# Patient Record
Sex: Male | Born: 1955 | Race: White | Hispanic: No | Smoking: Former smoker
Health system: Southern US, Community
[De-identification: ages and names within clinical notes are randomized; demographics above are authoritative.]

## PROBLEM LIST (undated history)

## (undated) DIAGNOSIS — I10 Essential (primary) hypertension: Secondary | ICD-10-CM

## (undated) DIAGNOSIS — E7800 Pure hypercholesterolemia, unspecified: Secondary | ICD-10-CM

---

## 1999-05-07 ENCOUNTER — Encounter: Payer: Self-pay | Admitting: Family Medicine

## 1999-05-07 ENCOUNTER — Ambulatory Visit (HOSPITAL_COMMUNITY): Admission: RE | Admit: 1999-05-07 | Discharge: 1999-05-07 | Payer: Self-pay | Admitting: Family Medicine

## 2001-01-08 ENCOUNTER — Encounter: Payer: Self-pay | Admitting: Family Medicine

## 2001-01-08 ENCOUNTER — Ambulatory Visit: Admission: RE | Admit: 2001-01-08 | Discharge: 2001-01-08 | Payer: Self-pay | Admitting: Family Medicine

## 2002-11-11 ENCOUNTER — Ambulatory Visit (HOSPITAL_COMMUNITY): Admission: RE | Admit: 2002-11-11 | Discharge: 2002-11-11 | Payer: Self-pay | Admitting: Family Medicine

## 2002-11-11 ENCOUNTER — Encounter: Payer: Self-pay | Admitting: Family Medicine

## 2002-12-04 ENCOUNTER — Encounter: Payer: Self-pay | Admitting: Family Medicine

## 2002-12-04 ENCOUNTER — Encounter: Payer: Self-pay | Admitting: Emergency Medicine

## 2002-12-04 ENCOUNTER — Emergency Department (HOSPITAL_COMMUNITY): Admission: EM | Admit: 2002-12-04 | Discharge: 2002-12-04 | Payer: Self-pay | Admitting: Emergency Medicine

## 2007-11-11 ENCOUNTER — Encounter (INDEPENDENT_AMBULATORY_CARE_PROVIDER_SITE_OTHER): Payer: Self-pay | Admitting: Family Medicine

## 2007-11-11 ENCOUNTER — Ambulatory Visit (HOSPITAL_COMMUNITY): Admission: RE | Admit: 2007-11-11 | Discharge: 2007-11-11 | Payer: Self-pay | Admitting: Family Medicine

## 2011-02-22 NOTE — Discharge Summary (Signed)
NAME:  Wayne Robinson, Wayne Robinson                       ACCOUNT NO.:  [id number]   MEDICAL RECORD NO.:  [id number]                   PATIENT TYPE:  EMS   LOCATION:  [facility]                                 FACILITY:  [facility]   PHYSICIAN:  Talmage Coin, M.D.                  DATE OF BIRTH:  1956/10/03   DATE OF ADMISSION:  12/04/2002  DATE OF DISCHARGE:  12/04/2002                                 DISCHARGE SUMMARY   DISCHARGE DIAGNOSES:  1. Atypical chest pain.  2. Tobacco use.  3. Anxiety disorder, not otherwise specified.  4. Dyslipidemia.   DISCHARGE MEDICATIONS:  1. Zoloft 25 mg daily.  2. Xanax 5 mg as needed as previously directed.  3. Zocor 40 mg daily.  4. Viagra as needed.   ADMISSION HISTORY:  The patient is a 55 year old man with anxiety disorder,  dyslipidemia and tobacco use.  He presented to [facility] Emergency  Department complaining of intermittent chest pain.  The episodes had been  infrequent, occurring every 3-4 months since 1997.  However, on the day of  admission he had several episodes of substernal chest tightness with  associated dyspnea, diaphoresis and lip tingling.  The episodes were  generally brief lasting less than one minute.  He describes no exertional  pain.  The pain was non pleuritic and had no other contributing factors.   PHYSICAL EXAMINATION:  VITAL SIGNS:  Temperature 98.7, blood pressure  122/75, pulse 68, respirations 15, O2 saturation 97% on 2 liters per minute  nasal cannula.  GENERAL:  He was in no apparent distress.  HEAD/EYES/NECK/ABDOMEN/EXTREMITIES/NEUROLOGIC/MENTAL STATUS/SKIN:  Exams are  unremarkable.  CARDIOVASCULAR:  Unremarkable except trace pedal edema, bilaterally.   LABORATORY DATA:  He had a few crackles at the left base, otherwise air  movement is good, had clear lung fields otherwise and with resonance  throughout.  Initial laboratory data included a CBC with WBC 9.4, hemoglobin  14.4, platelets 157, neutrophils 5.4,  PT 12, PTT 30, sodium 139, potassium  3.5, bicarbonate 28, BUN 20, creatinine 1.3, glucose 107, CK 139, MB 1.4,  troponin I 0.04.  EKG at the time of admission revealed normal sinus rate at  66, normal intervals, normal axis, possibly an early incomplete right bundle  branch block and nonspecific ST-T wave changes.  No old tracings were  available.  Portable chest x-ray showed patchy left lower lobe atelectasis  versus infiltrate.   HOSPITAL COURSE:  Atypical chest pain:  The episodes seems consistent with  panic attack and would not clearly consistent with an acute coronary  syndrome, or other life threatening causes of chest pain.  However, given  his risk factors for cardiovascular disease, and nonspecific EKG findings,  and the frequency of episodes on the day of admission, it was recommended to  the patient that we admit him to rule out myocardial infarction by serial  cardiac enzymes, telemetry and repeat EKG.  The patient was also informed  that the few crackles at the left lower lobe and the abnormality on chest  film may simply represent atelectasis or prior scarring from pneumonias in  the past, or possibly even an early pneumonia, while he did not have signs  including the onset of fever, productive cough, or leukocytosis.  Despite  our concerns that the patient should be evaluated to rule out myocardial  infarction, the patient decided to leave against medical advice on the day  of admission.  He was encouraged to follow up with his primary care  physician, Meredith Staggers, M.D. at [facility] [place]  [place].  The patient seems to be aware of risks and  benefits and appears to have the capacity to make medical decisions.                                               Talmage Coin, M.D.    Cleotis Lema  D:  12/05/2002  T:  12/05/2002  Job:  [id number]

## 2012-03-16 DIAGNOSIS — J189 Pneumonia, unspecified organism: Secondary | ICD-10-CM

## 2018-09-21 ENCOUNTER — Other Ambulatory Visit: Payer: Self-pay | Admitting: Family Medicine

## 2018-09-21 DIAGNOSIS — M5412 Radiculopathy, cervical region: Secondary | ICD-10-CM

## 2018-09-22 ENCOUNTER — Other Ambulatory Visit: Payer: Self-pay | Admitting: Family Medicine

## 2018-09-22 DIAGNOSIS — S0540XA Penetrating wound of orbit with or without foreign body, unspecified eye, initial encounter: Secondary | ICD-10-CM

## 2018-10-01 ENCOUNTER — Ambulatory Visit
Admission: RE | Admit: 2018-10-01 | Discharge: 2018-10-01 | Disposition: A | Discharge status: Home / Self Care | Payer: 59 | Source: Ambulatory Visit | Attending: Family Medicine | Admitting: Family Medicine

## 2018-10-01 DIAGNOSIS — S0540XA Penetrating wound of orbit with or without foreign body, unspecified eye, initial encounter: Secondary | ICD-10-CM

## 2018-10-01 DIAGNOSIS — M5412 Radiculopathy, cervical region: Secondary | ICD-10-CM

## 2018-11-18 DIAGNOSIS — M47812 Spondylosis without myelopathy or radiculopathy, cervical region: Secondary | ICD-10-CM | POA: Diagnosis not present

## 2018-11-18 DIAGNOSIS — M4802 Spinal stenosis, cervical region: Secondary | ICD-10-CM | POA: Diagnosis not present

## 2018-11-18 DIAGNOSIS — M5030 Other cervical disc degeneration, unspecified cervical region: Secondary | ICD-10-CM | POA: Diagnosis not present

## 2018-11-18 DIAGNOSIS — M5412 Radiculopathy, cervical region: Secondary | ICD-10-CM | POA: Diagnosis not present

## 2018-11-25 DIAGNOSIS — M5030 Other cervical disc degeneration, unspecified cervical region: Secondary | ICD-10-CM | POA: Diagnosis not present

## 2018-11-25 DIAGNOSIS — M9981 Other biomechanical lesions of cervical region: Secondary | ICD-10-CM | POA: Diagnosis not present

## 2018-11-25 DIAGNOSIS — M47812 Spondylosis without myelopathy or radiculopathy, cervical region: Secondary | ICD-10-CM | POA: Diagnosis not present

## 2018-12-08 DIAGNOSIS — G8929 Other chronic pain: Secondary | ICD-10-CM | POA: Diagnosis not present

## 2018-12-08 DIAGNOSIS — I10 Essential (primary) hypertension: Secondary | ICD-10-CM | POA: Diagnosis not present

## 2018-12-08 DIAGNOSIS — M542 Cervicalgia: Secondary | ICD-10-CM | POA: Diagnosis not present

## 2018-12-08 DIAGNOSIS — M5412 Radiculopathy, cervical region: Secondary | ICD-10-CM | POA: Diagnosis not present

## 2018-12-16 DIAGNOSIS — I10 Essential (primary) hypertension: Secondary | ICD-10-CM | POA: Diagnosis not present

## 2018-12-16 DIAGNOSIS — G8929 Other chronic pain: Secondary | ICD-10-CM | POA: Diagnosis not present

## 2018-12-16 DIAGNOSIS — M542 Cervicalgia: Secondary | ICD-10-CM | POA: Diagnosis not present

## 2018-12-16 DIAGNOSIS — M5412 Radiculopathy, cervical region: Secondary | ICD-10-CM | POA: Diagnosis not present

## 2018-12-22 DIAGNOSIS — I10 Essential (primary) hypertension: Secondary | ICD-10-CM | POA: Diagnosis not present

## 2018-12-22 DIAGNOSIS — G8929 Other chronic pain: Secondary | ICD-10-CM | POA: Diagnosis not present

## 2018-12-22 DIAGNOSIS — M5412 Radiculopathy, cervical region: Secondary | ICD-10-CM | POA: Diagnosis not present

## 2018-12-22 DIAGNOSIS — M542 Cervicalgia: Secondary | ICD-10-CM | POA: Diagnosis not present

## 2019-02-16 DIAGNOSIS — M5412 Radiculopathy, cervical region: Secondary | ICD-10-CM | POA: Diagnosis not present

## 2019-02-16 DIAGNOSIS — M436 Torticollis: Secondary | ICD-10-CM | POA: Diagnosis not present

## 2019-02-16 DIAGNOSIS — M542 Cervicalgia: Secondary | ICD-10-CM | POA: Diagnosis not present

## 2019-03-03 DIAGNOSIS — M5412 Radiculopathy, cervical region: Secondary | ICD-10-CM | POA: Diagnosis not present

## 2019-03-03 DIAGNOSIS — M436 Torticollis: Secondary | ICD-10-CM | POA: Diagnosis not present

## 2019-03-03 DIAGNOSIS — M542 Cervicalgia: Secondary | ICD-10-CM | POA: Diagnosis not present

## 2019-03-08 ENCOUNTER — Other Ambulatory Visit: Payer: Self-pay | Admitting: Family Medicine

## 2019-03-08 DIAGNOSIS — E782 Mixed hyperlipidemia: Secondary | ICD-10-CM | POA: Diagnosis not present

## 2019-03-08 DIAGNOSIS — M25511 Pain in right shoulder: Secondary | ICD-10-CM

## 2019-03-08 DIAGNOSIS — Z0000 Encounter for general adult medical examination without abnormal findings: Secondary | ICD-10-CM | POA: Diagnosis not present

## 2019-03-08 DIAGNOSIS — J309 Allergic rhinitis, unspecified: Secondary | ICD-10-CM | POA: Diagnosis not present

## 2019-03-08 DIAGNOSIS — I10 Essential (primary) hypertension: Secondary | ICD-10-CM | POA: Diagnosis not present

## 2019-03-23 DIAGNOSIS — M5412 Radiculopathy, cervical region: Secondary | ICD-10-CM | POA: Diagnosis not present

## 2019-03-23 DIAGNOSIS — R29898 Other symptoms and signs involving the musculoskeletal system: Secondary | ICD-10-CM | POA: Diagnosis not present

## 2019-03-23 DIAGNOSIS — M5030 Other cervical disc degeneration, unspecified cervical region: Secondary | ICD-10-CM | POA: Diagnosis not present

## 2019-04-06 DIAGNOSIS — E782 Mixed hyperlipidemia: Secondary | ICD-10-CM | POA: Diagnosis not present

## 2019-04-06 DIAGNOSIS — Z125 Encounter for screening for malignant neoplasm of prostate: Secondary | ICD-10-CM | POA: Diagnosis not present

## 2019-04-12 DIAGNOSIS — M5030 Other cervical disc degeneration, unspecified cervical region: Secondary | ICD-10-CM | POA: Diagnosis not present

## 2019-04-12 DIAGNOSIS — M542 Cervicalgia: Secondary | ICD-10-CM | POA: Diagnosis not present

## 2019-04-12 DIAGNOSIS — M5412 Radiculopathy, cervical region: Secondary | ICD-10-CM | POA: Diagnosis not present

## 2019-04-12 DIAGNOSIS — M4802 Spinal stenosis, cervical region: Secondary | ICD-10-CM | POA: Diagnosis not present

## 2019-05-04 ENCOUNTER — Other Ambulatory Visit: Payer: Self-pay

## 2019-05-04 ENCOUNTER — Ambulatory Visit
Admission: RE | Admit: 2019-05-04 | Discharge: 2019-05-04 | Disposition: A | Discharge status: Home / Self Care | Payer: 59 | Source: Ambulatory Visit | Attending: Family Medicine | Admitting: Family Medicine

## 2019-05-04 DIAGNOSIS — M25511 Pain in right shoulder: Secondary | ICD-10-CM | POA: Diagnosis not present

## 2019-05-24 DIAGNOSIS — M7541 Impingement syndrome of right shoulder: Secondary | ICD-10-CM | POA: Diagnosis not present

## 2019-05-24 DIAGNOSIS — M25511 Pain in right shoulder: Secondary | ICD-10-CM | POA: Diagnosis not present

## 2019-06-10 ENCOUNTER — Other Ambulatory Visit: Payer: Self-pay

## 2019-06-10 ENCOUNTER — Encounter: Payer: Self-pay | Admitting: Rehabilitative and Restorative Service Providers"

## 2019-06-10 ENCOUNTER — Ambulatory Visit (INDEPENDENT_AMBULATORY_CARE_PROVIDER_SITE_OTHER): Payer: BLUE CROSS/BLUE SHIELD | Admitting: Rehabilitative and Restorative Service Providers"

## 2019-06-10 DIAGNOSIS — M25511 Pain in right shoulder: Secondary | ICD-10-CM

## 2019-06-10 DIAGNOSIS — R29898 Other symptoms and signs involving the musculoskeletal system: Secondary | ICD-10-CM

## 2019-06-10 DIAGNOSIS — R293 Abnormal posture: Secondary | ICD-10-CM

## 2019-06-10 DIAGNOSIS — M542 Cervicalgia: Secondary | ICD-10-CM | POA: Diagnosis not present

## 2019-06-10 DIAGNOSIS — G8929 Other chronic pain: Secondary | ICD-10-CM

## 2019-06-10 NOTE — Patient Instructions (Signed)
Access Code: [email or web address]  URL: [email or web address]  Date: 06/10/2019  Prepared by: Gillermo Murdoch   Exercises  Seated Cervical Retraction - 10 reps - 1 sets - 3x daily - 7x weekly  Standing Scapular Retraction - 10 reps - 1 sets - 10 hold - 3x daily - 7x weekly  Shoulder External Rotation and Scapular Retraction - 10 reps - 1 sets - hold - 3x daily - 7x weekly  Doorway Pec Stretch at 60 Degrees Abduction - 3 reps - 1 sets - 3x daily - 7x weekly  Doorway Pec Stretch at 90 Degrees Abduction - 3 reps - 1 sets - 30 seconds hold - 3x daily - 7x weekly  Doorway Pec Stretch at 120 Degrees Abduction - 3 reps - 1 sets - 30 second hold hold - 3x daily - 7x weekly

## 2019-06-10 NOTE — Therapy (Addendum)
[facility] [address] [place], [place], [address] Phone: [phone]   Fax:  [phone]  Physical Therapy Evaluation  Patient Details  Name: Wayne Robinson MRN: [id number] Date of Birth: 1956-06-02 Referring Provider (PT): Dr Justice Britain    Encounter Date: 06/10/2019  PT End of Session - 06/10/19 1710    Visit Number  1    Number of Visits  12    Date for PT Re-Evaluation  07/22/19    PT Start Time  1708    PT Stop Time  8315    PT Time Calculation (min)  50 min    Activity Tolerance  Patient tolerated treatment well       History reviewed. No pertinent past medical history.  History reviewed. No pertinent surgical history.  There were no vitals filed for this visit.   Subjective Assessment - 06/10/19 1711    Subjective  Patient reports that he has had pain in the Rt shoulder and neck area for the past 15-16 months. He experienced a twinsting pull Rt Shoulder across body when his 60 pound dog pulled his arm while on his leash. He experienced pain but did not seek care until a few months later. Patient has had 2 previous courses of PT and several injections. Shoulder has imporved some but he continues to have pain and intermittent numbness and tingling iin Rt UE.    Pertinent History  HTN; high cholestrol    Diagnostic tests  MRI - RC tendonipathy/tendinosis primarily supraspinatus; posterior inferior labral tear; degenerative joint disease AC joint; thickening ov capsule at axillary recess - adhesive capsulitis    Patient Stated Goals  get rid of the pain and get back to normal function Rt UE    Currently in Pain?  Yes    Pain Score  1     Pain Location  Shoulder    Pain Orientation  Right    Pain Descriptors / Indicators  Dull;Aching    Pain Type  Chronic pain    Pain Radiating Towards  shoulder blade - sometimes tingling into arm and hand    Pain Onset  More than a month ago    Pain Frequency  Intermittent     Aggravating Factors   lying on Rt side; static postures at shoulder level; cold air on arm    Pain Relieving Factors  ice on shoulder; heat      See med list in Initial Evaluation    [facility] PT Assessment - 06/10/19 0001      Assessment   Medical Diagnosis  Rt shoulder dysfunction     Referring Provider (PT)  Dr Justice Britain     Onset Date/Surgical Date  01/05/18    Hand Dominance  Right    Next MD Visit  06/16/2019      Precautions   Precautions  None      Balance Screen   Has the patient fallen in the past 6 months  No    Has the patient had a decrease in activity level because of a fear of falling?   No    Is the patient reluctant to leave their home because of a fear of falling?   No      Prior Function   Level of Independence  Independent    Vocation  Full time employment    Vocation Requirements  works in [place] - 10 yrs       Sensation  Additional Comments  intermittent numbness and tingling in the Rt UE       Posture/Postural Control   Posture Comments  head ofrward; shoudlers rounded and elevated; head of the humerus anterior in orientation; scapulae abducted and rotated along the thoracic wall Rt > Lt       AROM   Right/Left Shoulder  --   assessed w/ pt standing; IR/ER w/ shd at 90 deg abd    Right Shoulder Extension  50 Degrees    Right Shoulder Flexion  155 Degrees    Right Shoulder ABduction  157 Degrees    Right Shoulder Internal Rotation  28 Degrees    Right Shoulder External Rotation  95 Degrees    Left Shoulder Extension  50 Degrees    Left Shoulder Flexion  154 Degrees    Left Shoulder ABduction  163 Degrees    Left Shoulder Internal Rotation  22 Degrees    Left Shoulder External Rotation  93 Degrees    Cervical Flexion  54    Cervical Extension  60    Cervical - Right Side Bend  34    Cervical - Left Side Bend  28    Cervical - Right Rotation  67    Cervical - Left Rotation  58      Strength   Overall Strength Comments  poor  posterior shoulder girdle strength/postural control       Palpation   Spinal mobility  tightness w/ CPA mobs lower cervical to upper thoracic spine     Palpation comment  muscular tightness Rt > Lt pecs; upper trpa; leveator; teres                Objective measurements completed on examination: See above findings.      [facility] Adult PT Treatment/Exercise - 06/10/19 0001      Therapeutic Activites    Therapeutic Activities  --   myofacial ball release work standing      Neuro Re-ed    Neuro Re-ed Details   postural education - neuromuscular re-education       Shoulder Exercises: Standing   Row Limitations  row stepping back bow and arrow - blue TB x 10 verbal and tactile cues for scapular position     Other Standing Exercises  axial extensin 10 sec x 5 reps; scap squeeze 10 sec x 5; L's x 10 with noodle       Shoulder Exercises: Stretch   Other Shoulder Stretches  doorway stretch 30 sec x 2 reps lower and mid position;  20-25 sec hold moving slightly toward higher position x 3 reps to pt tolerance     Other Shoulder Stretches  prolonged snow angel ~ 1 min x 1 rep UE's ~ 60 deg abd                   PT Long Term Goals - 06/10/19 1819      PT LONG TERM GOAL #1   Title  Iprove posture and alignment with patient to demonstrate improved upright posture with posterior shoulder girdle engaged    Time  6    Period  Weeks    Status  New    Target Date  07/22/19      PT LONG TERM GOAL #2   Title  Decrease frequency of Rt UE radicular symptoms by 50-70% alowing pt to use Rt UE more normally    Time  6    Period  Weeks  Status  New    Target Date  07/22/19      PT LONG TERM GOAL #3   Title  Return patient to normal functional activities with Rt UE with minimal symptoms    Time  6    Period  Weeks    Status  New    Target Date  07/22/19      PT LONG TERM GOAL #4   Title  Independent in HEP    Time  6    Period  Weeks    Status  New    Target Date   07/22/19             Plan - 06/10/19 1746    Clinical Impression Statement  Patient presents with Rt upper quarter dysfunction including Rt UE intermittnet numbness and tingling. Symptoms have been present for more than a year. He has poor posture and alignment; decreased posterior shoulder girdle strength and postural stability; intermittent neurological symptoms; muscular tightness to palpation. Patient will benefit from PY to address problems identified.    Stability/Clinical Decision Making  Stable/Uncomplicated    Clinical Decision Making  Low    Rehab Potential  Good    PT Frequency  2x / week    PT Duration  6 weeks    PT Treatment/Interventions  Patient/family education;ADLs/Self Care Home Management;Cryotherapy;Electrical Stimulation;Iontophoresis 4mg /ml Dexamethasone;Moist Heat;Ultrasound;Manual techniques;Neuromuscular re-education;Therapeutic activities;Therapeutic exercise;Taping;Dry needling    PT Next Visit Plan  review HEP; add manual work Rt shoulder/cervical musculature; postural correction/education; neuromuscular re-education; posterior shoulder girdle strengthening - FOTO    PT Home Exercise Plan  [id number]    Consulted and Agree with Plan of Care  Patient       Patient will benefit from skilled therapeutic intervention in order to improve the following deficits and impairments:  Pain, Increased fascial restricitons, Increased muscle spasms, Decreased strength, Decreased mobility, Decreased range of motion, Decreased activity tolerance  Visit Diagnosis: Chronic right shoulder pain - Plan: PT plan of care cert/re-cert  Cervicalgia - Plan: PT plan of care cert/re-cert  Other symptoms and signs involving the musculoskeletal system - Plan: PT plan of care cert/re-cert  Abnormal posture - Plan: PT plan of care cert/re-cert     Problem List There are no active problems to display for this patient.   Jennell Janosik Rober Minion PT, MPH  06/10/2019, 6:24 PM  [facility] [address] [place], [place], [address] Phone: [phone]   Fax:  [phone]  Name: Wayne Robinson MRN: [id number] Date of Birth: 20-Jul-1956

## 2019-06-16 DIAGNOSIS — M25511 Pain in right shoulder: Secondary | ICD-10-CM | POA: Diagnosis not present

## 2019-10-12 ENCOUNTER — Emergency Department
Admission: EM | Admit: 2019-10-12 | Discharge: 2019-10-12 | Disposition: A | Discharge status: Home / Self Care | Payer: BLUE CROSS/BLUE SHIELD | Source: Home / Self Care

## 2019-10-12 ENCOUNTER — Emergency Department (INDEPENDENT_AMBULATORY_CARE_PROVIDER_SITE_OTHER): Payer: BLUE CROSS/BLUE SHIELD

## 2019-10-12 ENCOUNTER — Other Ambulatory Visit: Payer: Self-pay

## 2019-10-12 DIAGNOSIS — R079 Chest pain, unspecified: Secondary | ICD-10-CM

## 2019-10-12 DIAGNOSIS — J209 Acute bronchitis, unspecified: Secondary | ICD-10-CM | POA: Diagnosis not present

## 2019-10-12 DIAGNOSIS — R0602 Shortness of breath: Secondary | ICD-10-CM | POA: Diagnosis not present

## 2019-10-12 HISTORY — DX: Essential (primary) hypertension: I10

## 2019-10-12 HISTORY — DX: Pure hypercholesterolemia, unspecified: E78.00

## 2019-10-12 MED ORDER — ALBUTEROL SULFATE HFA 108 (90 BASE) MCG/ACT IN AERS: 1.0000 | INHALATION_SPRAY | Freq: Four times a day (QID) | RESPIRATORY_TRACT | 0 refills | Status: DC | PRN

## 2019-10-12 MED ORDER — PREDNISONE 10 MG PO TABS: 40.0000 mg | ORAL_TABLET | Freq: Every day | ORAL | 0 refills | Status: AC

## 2019-10-12 NOTE — ED Triage Notes (Signed)
Going on for a while.  Burning in upper chest, comes and goes..will be fine for a few days then starts again.  Happens more when lying in bed in the morning.  Reminds him of when he previously had pneumonia

## 2019-10-12 NOTE — Discharge Instructions (Addendum)
Your chest xray was normal  We will treat your symptoms as bronchitis with prednisone and albuterol Please quarantine until receiving your covid swab

## 2019-10-12 NOTE — ED Provider Notes (Signed)
[facility]    CSN: [id number] Arrival date & time: 10/12/19  1712      History   Chief Complaint Chief Complaint  Patient presents with  . Back Pain  . Chest Pain    HPI Wayne Robinson is a 64 y.o. male.   64 year old male, with history of high cholesterol, hypertension, presenting today complaining of a burning sensation in his chest as well as some back pain.  Patient states that he has had this burning sensation in his upper chest, worse with deep breathing, for the past few weeks.  States that he is also had some pain in his thoracic region bilaterally.  States that he has had similar symptoms in the past and had pneumonia.  States that he does not want to be here today but his daughter is concerned and made him come.  Patient denies any cough or congestion.  No fever or chills.  The history is provided by the patient.  Chest Pain Pain location:  Substernal area Pain quality: burning   Pain radiates to:  Does not radiate Pain severity:  Mild Onset quality:  Gradual Duration:  4 weeks Timing:  Intermittent Progression:  Unchanged Chronicity:  Recurrent Context: breathing   Context: not drug use, not eating, not intercourse, not lifting and not movement   Relieved by:  Nothing Worsened by:  Nothing Ineffective treatments:  None tried Associated symptoms: back pain, cough and shortness of breath   Associated symptoms: no abdominal pain, no altered mental status, no claudication, no dizziness, no fatigue, no fever, no headache, no lower extremity edema, no near-syncope, no numbness, no palpitations, no vomiting and no weakness     Past Medical History:  Diagnosis Date  . High cholesterol   . Hypertension     There are no problems to display for this patient.   No past surgical history on file.     Home Medications    Prior to Admission medications   Medication Sig Start Date End Date Taking? Authorizing Provider  albuterol (VENTOLIN HFA) 108  (90 Base) MCG/ACT inhaler Inhale 1-2 puffs into the lungs every 6 (six) hours as needed for wheezing or shortness of breath. 10/12/19   Beonca Gibb C, PA-C  amLODipine-atorvastatin (CADUET) 5-10 MG tablet Take 1 tablet by mouth daily.    [provider]  aspirin EC 81 MG tablet Take 81 mg by mouth daily.    [provider]  atorvastatin (LIPITOR) 20 MG tablet Take 20 mg by mouth daily.    [provider]  losartan (COZAAR) 50 MG tablet Take 50 mg by mouth daily.    [provider]  predniSONE (DELTASONE) 10 MG tablet Take 4 tablets (40 mg total) by mouth daily for 5 days. 10/12/19 10/17/19  Alecia Lemming, PA-C    Family History No family history on file.  Social History Social History   Tobacco Use  . Smoking status: Former Smoker    Quit date: 10/12/2015    Years since quitting: 4.0  . Smokeless tobacco: Never Used  Substance Use Topics  . Alcohol use: Yes    Comment: very seldom  . Drug use: Not on file     Allergies   Gabapentin   Review of Systems Review of Systems  Constitutional: Negative for chills, fatigue and fever.  HENT: Negative for ear pain and sore throat.   Eyes: Negative for pain and visual disturbance.  Respiratory: Positive for cough and shortness of breath.  Cardiovascular: Positive for chest pain. Negative for palpitations, claudication and near-syncope.  Gastrointestinal: Negative for abdominal pain and vomiting.  Genitourinary: Negative for dysuria and hematuria.  Musculoskeletal: Positive for back pain. Negative for arthralgias.  Skin: Negative for color change and rash.  Neurological: Negative for dizziness, seizures, syncope, weakness, numbness and headaches.  All other systems reviewed and are negative.    Physical Exam Triage Vital Signs ED Triage Vitals  Enc Vitals Group     BP 10/12/19 1730 (!) 139/91     Pulse Rate 10/12/19 1730 69     Resp 10/12/19 1730 20     Temp 10/12/19 1730 98.9 F (37.2 C)      Temp src --      SpO2 10/12/19 1730 99 %     Weight 10/12/19 1731 185 lb (83.9 kg)     Height 10/12/19 1731 6' (1.829 m)     Head Circumference --      Peak Flow --      Pain Score 10/12/19 1731 3     Pain Loc --      Pain Edu? --      Excl. in GC? --    No data found.  Updated Vital Signs BP (!) 139/91 (BP Location: Left Arm)   Pulse 69   Temp 98.9 F (37.2 C)   Resp 20   Ht 6' (1.829 m)   Wt 185 lb (83.9 kg)   SpO2 99%   BMI 25.09 kg/m   Visual Acuity Right Eye Distance:   Left Eye Distance:   Bilateral Distance:    Right Eye Near:   Left Eye Near:    Bilateral Near:     Physical Exam Vitals and nursing note reviewed.  Constitutional:      Appearance: He is well-developed.  HENT:     Head: Normocephalic and atraumatic.  Eyes:     Conjunctiva/sclera: Conjunctivae normal.  Cardiovascular:     Rate and Rhythm: Normal rate and regular rhythm.     Heart sounds: No murmur.  Pulmonary:     Effort: Pulmonary effort is normal. No respiratory distress.     Breath sounds: Normal breath sounds.  Abdominal:     Palpations: Abdomen is soft.     Tenderness: There is no abdominal tenderness.  Musculoskeletal:     Cervical back: Neck supple.  Skin:    General: Skin is warm and dry.  Neurological:     Mental Status: He is alert.      UC Treatments / Results  Labs (all labs ordered are listed, but only abnormal results are displayed) Labs Reviewed  NOVEL CORONAVIRUS, NAA    EKG   Radiology DG Chest 2 View  Result Date: 10/12/2019 CLINICAL DATA:  Chest burning and shortness of breath. EXAM: CHEST - 2 VIEW COMPARISON:  12/01/2007. FINDINGS: The heart size and mediastinal contours are within normal limits. Both lungs are clear. The visualized skeletal structures are unremarkable. IMPRESSION: No active cardiopulmonary disease. Electronically Signed   By: Katherine Mantle M.D.   On: 10/12/2019 18:23    Procedures Procedures (including critical care  time)  Medications Ordered in UC Medications - No data to display  Initial Impression / Assessment and Plan / UC Course  I have reviewed the triage vital signs and the nursing notes.  Pertinent labs & imaging results that were available during my care of the patient were reviewed by me and considered in my medical decision making (see chart for details).  Burning sensation in his upper chest with deep breathing as well as some thoracic back pain.  Patient states he has had similar symptoms in the past and was diagnosed with pneumonia.  Chest x-ray pending.  Patient hesitant about Covid swab but has agreed. EKG unremarkable.  Chest x-ray without acute findings.  Will treat symptomatically for bronchitis with prednisone and albuterol.  He will quarantine until receiving results of his Covid swab. Final Clinical Impressions(s) / UC Diagnoses   Final diagnoses:  Chest pain, unspecified type  Acute bronchitis, unspecified organism     Discharge Instructions     Your chest xray was normal  We will treat your symptoms as bronchitis with prednisone and albuterol Please quarantine until receiving your covid swab    ED Prescriptions    Medication Sig Dispense Auth. Provider   predniSONE (DELTASONE) 10 MG tablet Take 4 tablets (40 mg total) by mouth daily for 5 days. 20 tablet [place] Dube C, PA-C   albuterol (VENTOLIN HFA) 108 (90 Base) MCG/ACT inhaler Inhale 1-2 puffs into the lungs every 6 (six) hours as needed for wheezing or shortness of breath. 8 g Alferd Obryant C, PA-C     PDMP not reviewed this encounter.   Phebe Colla, [place] 10/12/19 1830

## 2019-10-15 LAB — NOVEL CORONAVIRUS, NAA: SARS-CoV-2, NAA: NOT DETECTED

## 2020-03-13 DIAGNOSIS — Z125 Encounter for screening for malignant neoplasm of prostate: Secondary | ICD-10-CM | POA: Diagnosis not present

## 2020-03-13 DIAGNOSIS — J309 Allergic rhinitis, unspecified: Secondary | ICD-10-CM | POA: Diagnosis not present

## 2020-03-13 DIAGNOSIS — E782 Mixed hyperlipidemia: Secondary | ICD-10-CM | POA: Diagnosis not present

## 2020-03-13 DIAGNOSIS — I10 Essential (primary) hypertension: Secondary | ICD-10-CM | POA: Diagnosis not present

## 2020-03-13 DIAGNOSIS — Z0000 Encounter for general adult medical examination without abnormal findings: Secondary | ICD-10-CM | POA: Diagnosis not present

## 2020-03-13 DIAGNOSIS — F439 Reaction to severe stress, unspecified: Secondary | ICD-10-CM | POA: Diagnosis not present

## 2020-03-29 ENCOUNTER — Emergency Department (HOSPITAL_BASED_OUTPATIENT_CLINIC_OR_DEPARTMENT_OTHER): Payer: BLUE CROSS/BLUE SHIELD

## 2020-03-29 ENCOUNTER — Encounter (HOSPITAL_BASED_OUTPATIENT_CLINIC_OR_DEPARTMENT_OTHER): Payer: Self-pay | Admitting: Emergency Medicine

## 2020-03-29 ENCOUNTER — Other Ambulatory Visit: Payer: Self-pay

## 2020-03-29 ENCOUNTER — Encounter: Payer: Self-pay | Admitting: Emergency Medicine

## 2020-03-29 ENCOUNTER — Emergency Department (HOSPITAL_BASED_OUTPATIENT_CLINIC_OR_DEPARTMENT_OTHER)
Admission: EM | Admit: 2020-03-29 | Discharge: 2020-03-29 | Disposition: A | Discharge status: Home / Self Care | Payer: BLUE CROSS/BLUE SHIELD | Attending: Emergency Medicine | Admitting: Emergency Medicine

## 2020-03-29 ENCOUNTER — Emergency Department (INDEPENDENT_AMBULATORY_CARE_PROVIDER_SITE_OTHER)
Admission: EM | Admit: 2020-03-29 | Discharge: 2020-03-29 | Disposition: A | Discharge status: Home / Self Care | Payer: BLUE CROSS/BLUE SHIELD | Source: Home / Self Care | Attending: Family Medicine | Admitting: Family Medicine

## 2020-03-29 DIAGNOSIS — Z20822 Contact with and (suspected) exposure to covid-19: Secondary | ICD-10-CM | POA: Insufficient documentation

## 2020-03-29 DIAGNOSIS — R030 Elevated blood-pressure reading, without diagnosis of hypertension: Secondary | ICD-10-CM | POA: Diagnosis not present

## 2020-03-29 DIAGNOSIS — I10 Essential (primary) hypertension: Secondary | ICD-10-CM | POA: Diagnosis not present

## 2020-03-29 DIAGNOSIS — Z79899 Other long term (current) drug therapy: Secondary | ICD-10-CM | POA: Diagnosis not present

## 2020-03-29 DIAGNOSIS — R42 Dizziness and giddiness: Secondary | ICD-10-CM | POA: Diagnosis not present

## 2020-03-29 DIAGNOSIS — Z87891 Personal history of nicotine dependence: Secondary | ICD-10-CM | POA: Diagnosis not present

## 2020-03-29 DIAGNOSIS — R55 Syncope and collapse: Secondary | ICD-10-CM | POA: Diagnosis not present

## 2020-03-29 DIAGNOSIS — Z7982 Long term (current) use of aspirin: Secondary | ICD-10-CM | POA: Insufficient documentation

## 2020-03-29 DIAGNOSIS — R519 Headache, unspecified: Secondary | ICD-10-CM | POA: Diagnosis not present

## 2020-03-29 LAB — COMPREHENSIVE METABOLIC PANEL
ALT: 29 U/L (ref 0–44)
AST: 26 U/L (ref 15–41)
Albumin: 4.2 g/dL (ref 3.5–5.0)
Alkaline Phosphatase: 77 U/L (ref 38–126)
Anion gap: 9 (ref 5–15)
BUN: 17 mg/dL (ref 8–23)
CO2: 26 mmol/L (ref 22–32)
Calcium: 8.8 mg/dL — ABNORMAL LOW (ref 8.9–10.3)
Chloride: 104 mmol/L (ref 98–111)
Creatinine, Ser: 1.15 mg/dL (ref 0.61–1.24)
GFR calc Af Amer: >60 mL/min (ref >60)
GFR calc non Af Amer: >60 mL/min (ref >60)
Glucose, Bld: 106 mg/dL — ABNORMAL HIGH (ref 70–99)
Potassium: 3.7 mmol/L (ref 3.5–5.1)
Sodium: 139 mmol/L (ref 135–145)
Total Bilirubin: 0.8 mg/dL (ref 0.3–1.2)
Total Protein: 7.4 g/dL (ref 6.5–8.1)

## 2020-03-29 LAB — CBC WITH DIFFERENTIAL/PLATELET
Abs Immature Granulocytes: 0.02 10*3/uL (ref 0.00–0.07)
Basophils Absolute: 0.1 10*3/uL (ref 0.0–0.1)
Basophils Relative: 1 %
Eosinophils Absolute: 0.2 10*3/uL (ref 0.0–0.5)
Eosinophils Relative: 3 %
HCT: 45.6 % (ref 39.0–52.0)
Hemoglobin: 15.3 g/dL (ref 13.0–17.0)
Immature Granulocytes: 0 %
Lymphocytes Relative: 24 %
Lymphs Abs: 1.5 10*3/uL (ref 0.7–4.0)
MCH: 29.8 pg (ref 26.0–34.0)
MCHC: 33.6 g/dL (ref 30.0–36.0)
MCV: 88.9 fL (ref 80.0–100.0)
Monocytes Absolute: 0.5 10*3/uL (ref 0.1–1.0)
Monocytes Relative: 8 %
Neutro Abs: 3.9 10*3/uL (ref 1.7–7.7)
Neutrophils Relative %: 64 %
Platelets: 211 10*3/uL (ref 150–400)
RBC: 5.13 MIL/uL (ref 4.22–5.81)
RDW: 13 % (ref 11.5–15.5)
WBC: 6 10*3/uL (ref 4.0–10.5)
nRBC: 0 % (ref 0.0–0.2)

## 2020-03-29 LAB — TROPONIN I (HIGH SENSITIVITY)
Troponin I (High Sensitivity): 12 ng/L (ref <18)
Troponin I (High Sensitivity): 12 ng/L (ref <18)

## 2020-03-29 LAB — POCT FASTING CBG KUC MANUAL ENTRY: POCT Glucose (KUC): 139 mg/dL — AB (ref 70–99)

## 2020-03-29 LAB — SARS CORONAVIRUS 2 BY RT PCR (HOSPITAL ORDER, PERFORMED IN ~~LOC~~ HOSPITAL LAB): SARS Coronavirus 2: NEGATIVE

## 2020-03-29 MED ORDER — MECLIZINE HCL 25 MG PO TABS
25.0000 mg | ORAL_TABLET | Freq: Once | ORAL | Status: AC
  Administered 2020-03-29: 25 mg via ORAL
  Filled 2020-03-29: qty 1

## 2020-03-29 MED ORDER — MECLIZINE HCL 25 MG PO TABS: 25.0000 mg | ORAL_TABLET | Freq: Three times a day (TID) | ORAL | 0 refills | Status: DC | PRN

## 2020-03-29 NOTE — ED Triage Notes (Addendum)
Dizziness & lightheaded today pta, pt brought back to procedure room on arrival- took BP meds at 0630 - unsteady gait- pt skin clammy, c/o mild nausea- denies chest pain/ facial pain or arm numbness- denies back pain  Dr Cathren Harsh to room - EKG done for HTN - neuro intact-pt was driven to urgent care by co-worker

## 2020-03-29 NOTE — ED Notes (Signed)
Call to [facility] ER triage nurse Lanora Manis) relayed pt information - triage nurse to update Dr Adela Lank

## 2020-03-29 NOTE — Discharge Instructions (Signed)
Please follow up with your primary care doctor.  If your symptoms worsen, you develop new concerns or symptoms please seek additional medical care and evaluation.    Today you received medications that may make you sleepy or impair your ability to make decisions.  For the next 24 hours please do not drive, operate heavy machinery, care for a small child with out another adult present, or perform any activities that may cause harm to you or someone else if you were to fall asleep or be impaired.   Please follow up with your primary care doctor or the cardiologist as you appear to have occasional abnormal beats.   You are being prescribed a medication which may make you sleepy. Please follow up of listed precautions for at least 24 hours after taking one dose.

## 2020-03-29 NOTE — ED Triage Notes (Signed)
Pt reports dizziness this morning, sent from UC for further evaluation. Hx HTN, took his BP med this Am. denies chest pain nor shortness of breath. Reports dizziness better at this time. alert and oriented x 4, headache 3/10

## 2020-03-29 NOTE — ED Notes (Signed)
Writer waited in lobby w/ patient until co-worker arrived. Pt states he is less dizzy but now has a H/A. Last BP on R arm was 160/106. HR was 59. Pt refused EMS transport, Dr Cathren Harsh verbally confirmed pt okay to go to ER via private vehicle.

## 2020-03-29 NOTE — ED Provider Notes (Signed)
[facility]    CSN: [id number] Arrival date & time: 03/29/20  2355      History   Chief Complaint Chief Complaint  Patient presents with  . Dizziness    HPI Wayne Robinson is a 64 y.o. male.   Patient presents with complaint of dizziness and blurred vision.  Prior to arrival he had parked his vehicle at a local restaurant to eat breakfast and began to feel light-headed with vague vision difficulty.  He had mild nausea without vomiting and felt unsteady when walking. He denies headache, chest pain, shortness of breath, peripheral weakness, paresthesias, etc.  He has hypertension and reports that he had taken his medication this morning (Caduet and Losartan).   The history is provided by the patient.    Past Medical History:  Diagnosis Date  . High cholesterol   . Hypertension     Current problems:  hypertension and hyperlipidemia   History reviewed. No pertinent surgical history.     Home Medications    Prior to Admission medications   Medication Sig Start Date End Date Taking? Authorizing Provider  amLODipine-atorvastatin (CADUET) 5-10 MG tablet Take 1 tablet by mouth daily.   Yes [provider]  aspirin EC 81 MG tablet Take 81 mg by mouth daily.   Yes [provider]  atorvastatin (LIPITOR) 20 MG tablet Take 20 mg by mouth daily.   Yes [provider]  losartan (COZAAR) 50 MG tablet Take 50 mg by mouth daily.   Yes [provider]  albuterol (VENTOLIN HFA) 108 (90 Base) MCG/ACT inhaler Inhale 1-2 puffs into the lungs every 6 (six) hours as needed for wheezing or shortness of breath. 10/12/19   Blue, Belenda Cruise, PA-C    Family History Family History  Problem Relation Age of Onset  . Cancer Mother     Social History Social History   Tobacco Use  . Smoking status: Former Smoker    Quit date: 10/12/2015    Years since quitting: 4.4  . Smokeless tobacco: Never Used  Vaping Use  . Vaping Use: Never used    Substance Use Topics  . Alcohol use: Yes    Comment: very seldom  . Drug use: Never     Allergies   Gabapentin   Review of Systems Review of Systems  Constitutional: Positive for activity change and appetite change. Negative for chills, diaphoresis, fatigue and fever.  HENT: Negative.   Eyes: Positive for visual disturbance.  Respiratory: Negative for chest tightness.   Cardiovascular: Negative for leg swelling.  Genitourinary: Negative.   Musculoskeletal: Negative.   Skin: Negative.   Neurological: Positive for dizziness and light-headedness. Negative for tremors, seizures, syncope, facial asymmetry, speech difficulty and numbness.  Hematological: Negative.      Physical Exam Triage Vital Signs ED Triage Vitals  Enc Vitals Group     BP 03/29/20 0828 (!) 199/112     Pulse Rate 03/29/20 0828 66     Resp 03/29/20 0828 20     Temp --      Temp Source 03/29/20 0828 Oral     SpO2 03/29/20 0840 96 %     Weight --      Height --      Head Circumference --      Peak Flow --      Pain Score 03/29/20 0829 0     Pain Loc --      Pain Edu? --      Excl. in [place]? --  No data found.  Updated Vital Signs BP (!) 160/106 (BP Location: Right Arm)   Pulse (!) 59   Resp 18   SpO2 96%   Visual Acuity Right Eye Distance:   Left Eye Distance:   Bilateral Distance:    Right Eye Near:   Left Eye Near:    Bilateral Near:     Physical Exam Vitals and nursing note reviewed.  Constitutional:      General: He is not in acute distress.    Appearance: He is not ill-appearing or toxic-appearing.  HENT:     Head: Normocephalic.     Right Ear: Tympanic membrane normal.     Left Ear: Tympanic membrane normal.     Nose: Nose normal.     Mouth/Throat:     Pharynx: Oropharynx is clear.  Eyes:     Extraocular Movements: Extraocular movements intact.     Conjunctiva/sclera: Conjunctivae normal.     Pupils: Pupils are equal, round, and reactive to light.     Comments: No  nystagmus.  Fundi benign.  No photophobia  Cardiovascular:     Rate and Rhythm: Normal rate.     Heart sounds: Normal heart sounds.  Pulmonary:     Breath sounds: Normal breath sounds.  Abdominal:     Palpations: Abdomen is soft.     Tenderness: There is no abdominal tenderness.  Musculoskeletal:     Cervical back: Neck supple.     Right lower leg: No edema.     Left lower leg: No edema.  Lymphadenopathy:     Cervical: No cervical adenopathy.  Skin:    General: Skin is warm and dry.  Neurological:     Mental Status: He is alert and oriented to person, place, and time.     Cranial Nerves: Cranial nerves are intact.     Sensory: Sensation is intact.     Motor: Motor function is intact. No pronator drift.     Coordination: Romberg sign negative. Coordination normal. Finger-Nose-Finger Test normal.     Gait: Gait is intact.     Deep Tendon Reflexes:     Reflex Scores:      Brachioradialis reflexes are 2+ on the right side and 2+ on the left side.      Patellar reflexes are 2+ on the right side and 2+ on the left side.      Achilles reflexes are 2+ on the right side and 2+ on the left side.     UC Treatments / Results  Labs (all labs ordered are listed, but only abnormal results are displayed) Labs Reviewed  POCT FASTING CBG [facility] MANUAL ENTRY - Abnormal; Notable for the following components:      Result Value   POCT Glucose ([facility]) 139 (*)    All other components within normal limits    EKG  Rate:  69 BPM PR:  144 msec QT:  414 msec QTcH:  443 msec QRSD:  92 msec QRS axis:  21 degrees Interpretation:  Sinus rhythm with occasional PVC's; otherwise within normal limits   Radiology No results found.  Procedures Procedures (including critical care time)  Medications Ordered in UC Medications - No data to display  Initial Impression / Assessment and Plan / UC Course  I have reviewed the triage vital signs and the nursing notes.  Pertinent labs & imaging results  that were available during my care of the patient were reviewed by me and considered in my medical decision making (see chart  for details).    Patient's initial symptoms of light-headedness, unsteady gait, and vision changes with significantly elevated BP resolved.  Exam at discharge benign except elevated BP 160/106. Concern for possible TIA. Patient declines EMS transport to ED. Recommend further evaluation (patient's employer agrees to drive him to [facility] ED); vital signs stable.   Final Clinical Impressions(s) / UC Diagnoses   Final diagnoses:  Hypertension, poor control  Lightheadedness   Discharge Instructions   None    ED Prescriptions    None        Lattie Haw, MD 04/01/20 2212

## 2020-03-29 NOTE — ED Provider Notes (Signed)
[facility] EMERGENCY DEPARTMENT Provider Note   CSN: [id number] Arrival date & time: 03/29/20  1005     History Chief Complaint  Patient presents with  . Dizziness    Wayne Robinson is a 64 y.o. male with past medical history of hypertension, high cholesterol who presents today for evaluation of dizziness.  He reports that this morning he pulled into a restaurant to get breakfast and when he moved he felt very lightheaded like he was going to pass out.  He reports that he went to urgent care this morning, where note review shows that patient had an unsteady gait with clammy skin and mild nausea, and was noted to be hypertensive.  Patient states he took his blood pressure meds at 630 this morning.  He denies any medication changes.  He denies any cough or fevers.  He is on vaccinated against Covid.  He denies any shortness of breath.  He states that he developed a headache since this started.  He reports that overall he feels like his symptoms are improving.  He notes both times he felt very dizzy he had turned his head.  No neck pain.   View of urgent care notes shows his blood pressure was elevated at 160/106 with a normal CBG.  Patient denies any chiropractic adjustments.  His primary care doctor is with [facility] physicians.  No chest pain.  No abdominal pain.  At this time he is not nauseous. HPI     Past Medical History:  Diagnosis Date  . High cholesterol   . Hypertension     There are no problems to display for this patient.   History reviewed. No pertinent surgical history.     Family History  Problem Relation Age of Onset  . Cancer Mother     Social History   Tobacco Use  . Smoking status: Former Smoker    Quit date: 10/12/2015    Years since quitting: 4.4  . Smokeless tobacco: Never Used  Vaping Use  . Vaping Use: Never used  Substance Use Topics  . Alcohol use: Yes    Comment: very seldom  . Drug use: Never    Home Medications Prior to  Admission medications   Medication Sig Start Date End Date Taking? Authorizing Provider  albuterol (VENTOLIN HFA) 108 (90 Base) MCG/ACT inhaler Inhale 1-2 puffs into the lungs every 6 (six) hours as needed for wheezing or shortness of breath. 10/12/19   Blue, Olivia C, PA-C  amLODipine-atorvastatin (CADUET) 5-10 MG tablet Take 1 tablet by mouth daily.    [provider]  aspirin EC 81 MG tablet Take 81 mg by mouth daily.    [provider]  atorvastatin (LIPITOR) 20 MG tablet Take 20 mg by mouth daily.    [provider]  losartan (COZAAR) 50 MG tablet Take 50 mg by mouth daily.    [provider]  meclizine (ANTIVERT) 25 MG tablet Take 1 tablet (25 mg total) by mouth 3 (three) times daily as needed for dizziness. 03/29/20   Cristina Gong, PA-C    Allergies    Gabapentin  Review of Systems   Review of Systems  Constitutional: Negative for chills and fever.  Eyes: Positive for visual disturbance (At the time of feeling pre-syncopal reports that his vision was blurry. ).  Respiratory: Negative for chest tightness and shortness of breath.   Gastrointestinal: Positive for nausea. Negative for abdominal pain and vomiting.  Musculoskeletal: Negative for back pain and neck  pain.  Neurological: Positive for light-headedness and headaches. Negative for syncope, speech difficulty, weakness and numbness.  All other systems reviewed and are negative.   Physical Exam Updated Vital Signs BP (!) 166/93 (BP Location: Right Arm)   Pulse (!) 59   Temp 97.8 F (36.6 C)   Resp 20   Ht 6' (1.829 m)   Wt 83.2 kg   SpO2 99%   BMI 24.86 kg/m   Physical Exam Vitals and nursing note reviewed.  Constitutional:      Appearance: He is well-developed. He is not ill-appearing.  HENT:     Head: Normocephalic and atraumatic.     Right Ear: Tympanic membrane normal.     Left Ear: Tympanic membrane normal.     Mouth/Throat:     Mouth: Mucous membranes are moist.    Eyes:     Conjunctiva/sclera: Conjunctivae normal.  Cardiovascular:     Rate and Rhythm: Normal rate and regular rhythm.     Pulses: Normal pulses.     Heart sounds: Normal heart sounds. No murmur heard.   Pulmonary:     Effort: Pulmonary effort is normal. No respiratory distress.     Breath sounds: Normal breath sounds.  Abdominal:     Palpations: Abdomen is soft.     Tenderness: There is no abdominal tenderness. There is no guarding.  Musculoskeletal:     Cervical back: Normal range of motion and neck supple. No tenderness.     Right lower leg: No edema.     Left lower leg: No edema.  Skin:    General: Skin is warm and dry.  Neurological:     Mental Status: He is alert.     Comments: Mental Status:  Alert, oriented, thought content appropriate, able to give a coherent history. Speech fluent without evidence of aphasia. Able to follow 2 step commands without difficulty.  Cranial Nerves:  II:  Peripheral visual fields grossly normal, pupils equal, round, reactive to light III,IV, VI: ptosis not present, extra-ocular motions intact bilaterally  V,VII: smile symmetric, facial light touch sensation equal VIII: hearing grossly normal to voice  X: uvula elevates symmetrically  XI: bilateral shoulder shrug symmetric and strong XII: midline tongue extension without fassiculations Motor:  Normal tone. 5/5 in upper and lower extremities bilaterally including strong and equal grip strength and dorsiflexion/plantar flexion Cerebellar: normal finger-to-nose with bilateral upper extremities Gait: normal gait and balance CV: distal pulses palpable throughout    Psychiatric:        Mood and Affect: Mood normal.        Behavior: Behavior normal.     ED Results / Procedures / Treatments   Labs (all labs ordered are listed, but only abnormal results are displayed) Labs Reviewed  COMPREHENSIVE METABOLIC PANEL - Abnormal; Notable for the following components:      Result Value    Glucose, Bld 106 (*)    Calcium 8.8 (*)    All other components within normal limits  SARS CORONAVIRUS 2 BY RT PCR (HOSPITAL ORDER, PERFORMED IN [facility])  CBC WITH DIFFERENTIAL/PLATELET  TROPONIN I (HIGH SENSITIVITY)  TROPONIN I (HIGH SENSITIVITY)    EKG EKG Interpretation  Date/Time:  Wednesday March 29 2020 11:11:58 EDT Ventricular Rate:  60 PR Interval:    QRS Duration: 93 QT Interval:  399 QTC Calculation: 399 R Axis:   42 Text Interpretation: Sinus rhythm Borderline low voltage, extremity leads No significant change since last tracing Confirmed by Melene Plan [phone]) on  03/29/2020 1:06:48 PM   Radiology DG Chest 2 View  Result Date: 03/29/2020 CLINICAL DATA:  Presyncope. EXAM: CHEST - 2 VIEW COMPARISON:  10/12/2019 FINDINGS: The heart size and mediastinal contours are within normal limits. Both lungs are clear. The visualized skeletal structures are unremarkable. IMPRESSION: No active cardiopulmonary disease. Electronically Signed   By: Danae Orleans M.D.   On: 03/29/2020 11:54   CT Head Wo Contrast  Result Date: 03/29/2020 CLINICAL DATA:  TIA. Additional history provided: Elevated blood pressure this morning with lightheadedness. EXAM: CT HEAD WITHOUT CONTRAST TECHNIQUE: Contiguous axial images were obtained from the base of the skull through the vertex without intravenous contrast. COMPARISON:  No pertinent prior studies available for comparison. FINDINGS: Brain: Cerebral volume is normal for age. There is no acute intracranial hemorrhage. No demarcated cortical infarct. No extra-axial fluid collection. No evidence of intracranial mass. No midline shift. Vascular: No hyperdense vessel. Skull: Normal. Negative for fracture or focal lesion. Sinuses/Orbits: Visualized orbits show no acute finding. Trace ethmoid and maxillary sinus mucosal thickening at the imaged levels. No significant mastoid effusion. IMPRESSION: Unremarkable non-contrast CT appearance of the brain. No  evidence of acute intracranial abnormality. Trace ethmoid and maxillary sinus mucosal thickening at the imaged levels. Electronically Signed   By: Jackey Loge DO   On: 03/29/2020 11:57    Procedures Procedures (including critical care time)  Medications Ordered in ED Medications  meclizine (ANTIVERT) tablet 25 mg (25 mg Oral Given 03/29/20 1349)    ED Course  I have reviewed the triage vital signs and the nursing notes.  Pertinent labs & imaging results that were available during my care of the patient were reviewed by me and considered in my medical decision making (see chart for details).  Clinical Course as of Mar 29 1841  Wed Mar 29, 2020  1121 Rhythm strip appears to show intermittent ventricular bigeminy.      [EH]    Clinical Course User Index [EH] Norman Clay   MDM Rules/Calculators/A&P                         Patient is a 64 year old man who presents today for evaluation of feeling off balance that started after he turned his head.  His symptoms have been spontaneously improving.  On exam he is overall well-appearing and neurovascularly intact.  He denies any chiropractic adjustments, does not have any neck pain, do not suspect dissection.   CBC, CMP obtained without significant acute abnormalities.  Troponin x2 is not elevated.  Covid test is negative.  Chest x-ray is unremarkable.  CT head shows normal-appearing CT brain with trace ethmoid and maxillary sinus mucosal thickening.  He was observed on cardiac monitoring.  He was noted to have 1 brief run of ventricular bigeminy however was completely asymptomatic during this.  Given negative troponin x2 suspect that this is incidental especially as he was asymptomatic.  This patient was seen as a shared visit with Dr. Adela Lank.  Suspect BPPV.  Dr. Adela Lank had discussion with patient regarding disposition options today including hospital admission for further evaluation including continued cardiac monitoring, possible MRI  versus discharge home.  Patient elected for discharge home after risks discussed.  He is treated with meclizine while in the emergency room.  He is given a prescription for continued meclizine at home with instructions that this may make him drowsy and he should not drive, operate heavy machinery or perform other dangerous tasks while taking this.  Return precautions were discussed with patient who states their understanding.  At the time of discharge patient denied any unaddressed complaints or concerns.  Patient is agreeable for discharge home.  Note: Portions of this report may have been transcribed using voice recognition software. Every effort was made to ensure accuracy; however, inadvertent computerized transcription errors may be present  Final Clinical Impression(s) / ED Diagnoses Final diagnoses:  Dizziness    Rx / DC Orders ED Discharge Orders         Ordered    meclizine (ANTIVERT) 25 MG tablet  3 times daily PRN     Discontinue  Reprint     03/29/20 [place], [place], PA-C 03/29/20 [place], [place], DO 03/30/20 [id number]

## 2020-07-13 DIAGNOSIS — Z23 Encounter for immunization: Secondary | ICD-10-CM | POA: Diagnosis not present

## 2020-09-14 DIAGNOSIS — Z23 Encounter for immunization: Secondary | ICD-10-CM | POA: Diagnosis not present

## 2021-02-13 ENCOUNTER — Emergency Department
Admission: EM | Admit: 2021-02-13 | Discharge: 2021-02-13 | Disposition: A | Discharge status: Home / Self Care | Payer: BC Managed Care – PPO | Source: Home / Self Care

## 2021-02-13 ENCOUNTER — Other Ambulatory Visit: Payer: Self-pay

## 2021-02-13 ENCOUNTER — Encounter: Payer: Self-pay | Admitting: Emergency Medicine

## 2021-02-13 DIAGNOSIS — J309 Allergic rhinitis, unspecified: Secondary | ICD-10-CM

## 2021-02-13 DIAGNOSIS — J029 Acute pharyngitis, unspecified: Secondary | ICD-10-CM | POA: Diagnosis not present

## 2021-02-13 MED ORDER — IBUPROFEN 800 MG PO TABS: 800.0000 mg | ORAL_TABLET | Freq: Three times a day (TID) | ORAL | 0 refills | Status: DC

## 2021-02-13 MED ORDER — AZITHROMYCIN 250 MG PO TABS: 250.0000 mg | ORAL_TABLET | Freq: Every day | ORAL | 0 refills | Status: DC

## 2021-02-13 MED ORDER — FEXOFENADINE HCL 180 MG PO TABS: 180.0000 mg | ORAL_TABLET | Freq: Every day | ORAL | 0 refills | Status: DC

## 2021-02-13 NOTE — ED Triage Notes (Signed)
Patient c/o sore throat x 2 days, low grade fever, stuffy nose, burning in chest.  Patient has taken Advil and Nasacort.  Patient is not vaccinated.

## 2021-02-13 NOTE — Discharge Instructions (Signed)
Advised/instructed patient conservative measures for the next 2 to 3 days with Allegra 180 mg without D daily x5 days, Ibuprofen 800 mg 1-2 times daily x 3-5 days, and increase daily water intake while taking these medications.  We have prescribed Z-Pak to patient as well and have asked him to hold start of this medication for 2 to 3 days if symptoms worsen he can start Z-Pak and take to completion.

## 2021-02-13 NOTE — ED Provider Notes (Signed)
[facility]    CSN: [id number] Arrival date & time: 02/13/21  1114      History   Chief Complaint Chief Complaint  Patient presents with  . Sore Throat    HPI Wayne Robinson is a 65 y.o. male.   HPI 65 year old male presents with sore throat for 2 days, low-grade fever and mild nasal congestion.  Patient reports Nasacort has not relieved his symptoms.  Reports taking 400 mg of Advil earlier this morning.  Past Medical History:  Diagnosis Date  . High cholesterol   . Hypertension     There are no problems to display for this patient.   History reviewed. No pertinent surgical history.     Home Medications    Prior to Admission medications   Medication Sig Start Date End Date Taking? Authorizing Provider  amLODipine (NORVASC) 5 MG tablet Take 5 mg by mouth daily.   Yes [provider]  aspirin EC 81 MG tablet Take 81 mg by mouth daily.   Yes [provider]  atorvastatin (LIPITOR) 20 MG tablet Take 20 mg by mouth daily.   Yes [provider]  azithromycin (ZITHROMAX) 250 MG tablet Take 1 tablet (250 mg total) by mouth daily. Take first 2 tablets together, then 1 every day until finished. 02/13/21  Yes Trevor Iha, FNP  fexofenadine [facility] ALLERGY) 180 MG tablet Take 1 tablet (180 mg total) by mouth daily. 02/13/21 03/15/21 Yes Trevor Iha, FNP  ibuprofen (ADVIL) 800 MG tablet Take 1 tablet (800 mg total) by mouth 3 (three) times daily. 02/13/21  Yes Trevor Iha, FNP  losartan (COZAAR) 50 MG tablet Take 50 mg by mouth daily.   Yes [provider]  meclizine (ANTIVERT) 25 MG tablet Take 1 tablet (25 mg total) by mouth 3 (three) times daily as needed for dizziness. 03/29/20  Yes Cristina Gong, PA-C  albuterol (VENTOLIN HFA) 108 (90 Base) MCG/ACT inhaler Inhale 1-2 puffs into the lungs every 6 (six) hours as needed for wheezing or shortness of breath. 10/12/19   Blue, Olivia C, PA-C  amLODipine-atorvastatin (CADUET)  5-10 MG tablet Take 1 tablet by mouth daily.    [provider]    Family History Family History  Problem Relation Age of Onset  . Cancer Mother     Social History Social History   Tobacco Use  . Smoking status: Former Smoker    Quit date: 10/12/2015    Years since quitting: 5.3  . Smokeless tobacco: Never Used  Vaping Use  . Vaping Use: Never used  Substance Use Topics  . Alcohol use: Yes    Comment: very seldom  . Drug use: Never     Allergies   Gabapentin   Review of Systems Review of Systems  Constitutional: Positive for fatigue.  HENT: Positive for congestion and sore throat.   Eyes: Negative.   Respiratory: Negative.   Cardiovascular: Negative.   Gastrointestinal: Negative.   Genitourinary: Negative.   Musculoskeletal: Negative.   Skin: Negative.   Neurological: Negative.      Physical Exam Triage Vital Signs ED Triage Vitals  Enc Vitals Group     BP 02/13/21 1130 117/73     Pulse Rate 02/13/21 1130 62     Resp --      Temp 02/13/21 1130 98.3 F (36.8 C)     Temp Source 02/13/21 1130 Oral     SpO2 02/13/21 1130 97 %     Weight --  Height --      Head Circumference --      Peak Flow --      Pain Score 02/13/21 1132 1     Pain Loc --      Pain Edu? --      Excl. in GC? --    No data found.  Updated Vital Signs BP 117/73 (BP Location: Left Arm)   Pulse 62   Temp 98.3 F (36.8 C) (Oral)   SpO2 97%      Physical Exam Vitals and nursing note reviewed.  Constitutional:      General: He is not in acute distress.    Appearance: He is well-developed and normal weight. He is not ill-appearing or toxic-appearing.  HENT:     Head: Normocephalic and atraumatic.     Right Ear: Tympanic membrane and ear canal normal.     Left Ear: Tympanic membrane and ear canal normal.     Nose: Congestion present.     Mouth/Throat:     Mouth: Mucous membranes are moist. No oral lesions.     Pharynx: Oropharynx is clear. Uvula midline.  Posterior oropharyngeal erythema present. No pharyngeal swelling, oropharyngeal exudate or uvula swelling.     Tonsils: No tonsillar exudate or tonsillar abscesses. 0 on the right. 0 on the left.  Eyes:     Conjunctiva/sclera: Conjunctivae normal.     Pupils: Pupils are equal, round, and reactive to light.  Cardiovascular:     Rate and Rhythm: Normal rate and regular rhythm.     Heart sounds: Normal heart sounds.  Pulmonary:     Effort: Pulmonary effort is normal.     Breath sounds: Normal breath sounds.     Comments: No adventitious breath sounds noted. Musculoskeletal:     Cervical back: Normal range of motion and neck supple.  Lymphadenopathy:     Cervical: No cervical adenopathy.  Skin:    General: Skin is warm and dry.  Neurological:     General: No focal deficit present.     Mental Status: He is alert and oriented to person, place, and time.      UC Treatments / Results  Labs (all labs ordered are listed, but only abnormal results are displayed) Labs Reviewed - No data to display  EKG   Radiology No results found.  Procedures Procedures (including critical care time)  Medications Ordered in UC Medications - No data to display  Initial Impression / Assessment and Plan / UC Course  I have reviewed the triage vital signs and the nursing notes.  Pertinent labs & imaging results that were available during my care of the patient were reviewed by me and considered in my medical decision making (see chart for details).     MDM: 1.  Pharyngitis, 2.  Allergic rhinitis.  Patient discharged home, hemodynamically stable. Final Clinical Impressions(s) / UC Diagnoses   Final diagnoses:  Pharyngitis, unspecified etiology  Allergic rhinitis, unspecified seasonality, unspecified trigger     Discharge Instructions     Advised/instructed patient conservative measures for the next 2 to 3 days with Allegra 180 mg without D daily x5 days, Ibuprofen 800 mg 1-2 times daily x  3-5 days, and increase daily water intake while taking these medications.  We have prescribed Z-Pak to patient as well and have asked him to hold start of this medication for 2 to 3 days if symptoms worsen he can start Z-Pak and take to completion.    ED Prescriptions  Medication Sig Dispense Auth. Provider   azithromycin (ZITHROMAX) 250 MG tablet Take 1 tablet (250 mg total) by mouth daily. Take first 2 tablets together, then 1 every day until finished. 6 tablet Trevor Iha, FNP   ibuprofen (ADVIL) 800 MG tablet Take 1 tablet (800 mg total) by mouth 3 (three) times daily. 21 tablet Trevor Iha, FNP   fexofenadine [facility] ALLERGY) 180 MG tablet Take 1 tablet (180 mg total) by mouth daily. 30 tablet Trevor Iha, FNP     PDMP not reviewed this encounter.   Trevor Iha, FNP 02/13/21 1213

## 2021-03-29 DIAGNOSIS — F330 Major depressive disorder, recurrent, mild: Secondary | ICD-10-CM | POA: Diagnosis not present

## 2021-03-29 DIAGNOSIS — Z125 Encounter for screening for malignant neoplasm of prostate: Secondary | ICD-10-CM | POA: Diagnosis not present

## 2021-03-29 DIAGNOSIS — I10 Essential (primary) hypertension: Secondary | ICD-10-CM | POA: Diagnosis not present

## 2021-03-29 DIAGNOSIS — E782 Mixed hyperlipidemia: Secondary | ICD-10-CM | POA: Diagnosis not present

## 2021-03-29 DIAGNOSIS — N529 Male erectile dysfunction, unspecified: Secondary | ICD-10-CM | POA: Diagnosis not present

## 2021-03-29 DIAGNOSIS — Z0000 Encounter for general adult medical examination without abnormal findings: Secondary | ICD-10-CM | POA: Diagnosis not present

## 2021-06-07 DIAGNOSIS — F330 Major depressive disorder, recurrent, mild: Secondary | ICD-10-CM | POA: Diagnosis not present

## 2021-06-07 DIAGNOSIS — E782 Mixed hyperlipidemia: Secondary | ICD-10-CM | POA: Diagnosis not present

## 2021-06-07 DIAGNOSIS — I10 Essential (primary) hypertension: Secondary | ICD-10-CM | POA: Diagnosis not present

## 2021-06-07 DIAGNOSIS — F439 Reaction to severe stress, unspecified: Secondary | ICD-10-CM | POA: Diagnosis not present

## 2021-06-14 ENCOUNTER — Other Ambulatory Visit: Payer: Self-pay

## 2021-06-14 ENCOUNTER — Emergency Department (HOSPITAL_BASED_OUTPATIENT_CLINIC_OR_DEPARTMENT_OTHER)
Admission: EM | Admit: 2021-06-14 | Discharge: 2021-06-14 | Disposition: A | Discharge status: Home / Self Care | Payer: Worker's Compensation | Attending: Emergency Medicine | Admitting: Emergency Medicine

## 2021-06-14 ENCOUNTER — Encounter (HOSPITAL_BASED_OUTPATIENT_CLINIC_OR_DEPARTMENT_OTHER): Payer: Self-pay

## 2021-06-14 DIAGNOSIS — S31151A Open bite of abdominal wall, left upper quadrant without penetration into peritoneal cavity, initial encounter: Secondary | ICD-10-CM | POA: Insufficient documentation

## 2021-06-14 DIAGNOSIS — W540XXA Bitten by dog, initial encounter: Secondary | ICD-10-CM | POA: Diagnosis not present

## 2021-06-14 DIAGNOSIS — I10 Essential (primary) hypertension: Secondary | ICD-10-CM | POA: Diagnosis not present

## 2021-06-14 DIAGNOSIS — Z7982 Long term (current) use of aspirin: Secondary | ICD-10-CM | POA: Insufficient documentation

## 2021-06-14 DIAGNOSIS — Z87891 Personal history of nicotine dependence: Secondary | ICD-10-CM | POA: Insufficient documentation

## 2021-06-14 DIAGNOSIS — Z79899 Other long term (current) drug therapy: Secondary | ICD-10-CM | POA: Insufficient documentation

## 2021-06-14 DIAGNOSIS — Z23 Encounter for immunization: Secondary | ICD-10-CM | POA: Insufficient documentation

## 2021-06-14 MED ORDER — TETANUS-DIPHTH-ACELL PERTUSSIS 5-2.5-18.5 LF-MCG/0.5 IM SUSY
0.5000 mL | PREFILLED_SYRINGE | Freq: Once | INTRAMUSCULAR | Status: AC
  Administered 2021-06-14: 0.5 mL via INTRAMUSCULAR
  Filled 2021-06-14: qty 0.5

## 2021-06-14 MED ORDER — AMOXICILLIN-POT CLAVULANATE 875-125 MG PO TABS: 1.0000 | ORAL_TABLET | Freq: Two times a day (BID) | ORAL | 0 refills | Status: DC

## 2021-06-14 NOTE — ED Triage Notes (Signed)
Pt reports ~9am  he was in a customer's home and bit by their dog to left abd-seen/sent from [facility]-NAD-steady gait

## 2021-06-14 NOTE — ED Provider Notes (Signed)
[facility] EMERGENCY DEPARTMENT Provider Note   CSN: [id number] Arrival date & time: 06/14/21  1136     History Chief Complaint  Patient presents with   Animal Bite    Wayne Robinson is a 65 y.o. male.  65 year old male presents with dog bite to the left upper abdomen which occurred earlier today.  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  Last tetanus was 5 years ago.  No other injuries.  Denies any degree of significant abdominal pain, vomiting or other concerns.  Patient went to urgent care but was sent to the emergency room for an x-ray, he feels like he is fine.  Patient has already been in contact with animal control regarding verifying the animal's vaccine status, reportedly up-to-date.      Past Medical History:  Diagnosis Date   High cholesterol    Hypertension     There are no problems to display for this patient.   History reviewed. No pertinent surgical history.     Family History  Problem Relation Age of Onset   Cancer Mother     Social History   Tobacco Use   Smoking status: Former    Types: Cigarettes    Quit date: 10/12/2015    Years since quitting: 5.6   Smokeless tobacco: Never  Vaping Use   Vaping Use: Never used  Substance Use Topics   Alcohol use: Yes    Comment: very seldom   Drug use: Never    Home Medications Prior to Admission medications   Medication Sig Start Date End Date Taking? Authorizing Provider  amoxicillin-clavulanate (AUGMENTIN) 875-125 MG tablet Take 1 tablet by mouth every 12 (twelve) hours. 06/14/21  Yes Jeannie Fend, PA-C  albuterol (VENTOLIN HFA) 108 (90 Base) MCG/ACT inhaler Inhale 1-2 puffs into the lungs every 6 (six) hours as needed for wheezing or shortness of breath. 10/12/19   Blue, Olivia C, PA-C  amLODipine (NORVASC) 5 MG tablet Take 5 mg by mouth daily.     [provider]  amLODipine-atorvastatin (CADUET) 5-10 MG tablet Take 1 tablet by mouth daily.    [provider]  aspirin EC 81 MG tablet Take 81 mg by mouth daily.    [provider]  atorvastatin (LIPITOR) 20 MG tablet Take 20 mg by mouth daily.    [provider]  azithromycin (ZITHROMAX) 250 MG tablet Take 1 tablet (250 mg total) by mouth daily. Take first 2 tablets together, then 1 every day until finished. 02/13/21   Trevor Iha, FNP  fexofenadine [facility] ALLERGY) 180 MG tablet Take 1 tablet (180 mg total) by mouth daily. 02/13/21 03/15/21  Trevor Iha, FNP  ibuprofen (ADVIL) 800 MG tablet Take 1 tablet (800 mg total) by mouth 3 (three) times daily. 02/13/21   Trevor Iha, FNP  losartan (COZAAR) 50 MG tablet Take 50 mg by mouth daily.    [provider]  meclizine (ANTIVERT) 25 MG tablet Take 1 tablet (25 mg total) by mouth 3 (three) times daily as needed for dizziness. 03/29/20   Cristina Gong, PA-C    Allergies    Gabapentin  Review of Systems   Review of Systems  Constitutional:  Negative for fever.  Gastrointestinal:  Negative for abdominal pain, nausea and vomiting.  Skin:  Positive for wound.  Allergic/Immunologic: Negative for immunocompromised state.  Hematological:  Does not bruise/bleed easily.   Physical Exam Updated Vital Signs BP (!) 161/92 (BP Location: Left Arm)   Pulse 67   Temp 98.4 F (36.9 C) (Oral)   Resp 18   Ht 6' (1.829 m)   Wt 84.4 kg   SpO2 100%   BMI 25.23 kg/m   Physical Exam Vitals and nursing note reviewed.  Constitutional:      General: He is not in acute distress.    Appearance: He is well-developed. He is not diaphoretic.  HENT:     Head: Normocephalic and atraumatic.  Pulmonary:     Effort: Pulmonary effort is normal.  Abdominal:     Palpations: Abdomen is soft.     Tenderness: There is no abdominal tenderness.  Skin:    General: Skin is warm and dry.     Comments:  Superficial wound to left upper quadrant, no punctures.No bleeding.  Neurological:     Mental Status: He is alert and oriented to person, place, and time.     Gait: Gait normal.  Psychiatric:        Behavior: Behavior normal.    ED Results / Procedures / Treatments   Labs (all labs ordered are listed, but only abnormal results are displayed) Labs Reviewed - No data to display  EKG None  Radiology No results found.  Procedures Procedures   Medications Ordered in ED Medications  Tdap (BOOSTRIX) injection 0.5 mL (has no administration in time range)    ED Course  I have reviewed the triage vital signs and the nursing notes.  Pertinent labs & imaging results that were available during my care of the patient were reviewed by me and considered in my medical decision making (see chart for details).  Clinical Course as of 06/14/21 1227  Thu Jun 14, 2021  [id number] 65 year old male with dog bite to left upper abdomen which occurred around 9 AM today.  Patient is well-appearing, his abdomen is soft and nontender with exception of mild tenderness around a superficial abrasion/contusion wound to the left upper abdomen.  There are no puncture wounds.  Recommend topical bacitracin to the wound, given prescription for Augmentin to start if there is any concern for infection.  Recommend wound check with his PCP in 2 days.  Patient to follow-up regarding vaccine status, report has been made to animal control.  Tetanus updated today. [LM]    Clinical Course User Index [LM] Alden Hipp   MDM Rules/Calculators/A&P                           Final Clinical Impression(s) / ED Diagnoses Final diagnoses:  Dog bite, initial encounter    Rx / DC Orders ED Discharge Orders          Ordered    amoxicillin-clavulanate (AUGMENTIN) 875-125 MG tablet  Every 12 hours        06/14/21 1221             Jeannie Fend, PA-C 06/14/21 1228    Koleen Distance, MD 06/14/21 [phone]

## 2021-06-14 NOTE — Discharge Instructions (Addendum)
Apply bacitracin to the wound for now.  If there is concern for infection, start Augmentin and recheck with your doctor.  Recommend wound recheck in 2 days with your primary care provider.  Follow-up with animal control regarding verifying dog's vaccine status.

## 2021-06-14 NOTE — ED Notes (Signed)
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  Patient reports animal control was notified, client states the dogs vaccinations are current, bite marks appear superficial.

## 2021-06-18 ENCOUNTER — Emergency Department (HOSPITAL_BASED_OUTPATIENT_CLINIC_OR_DEPARTMENT_OTHER)
Admission: EM | Admit: 2021-06-18 | Discharge: 2021-06-18 | Disposition: A | Discharge status: Home / Self Care | Payer: Worker's Compensation | Attending: Emergency Medicine | Admitting: Emergency Medicine

## 2021-06-18 ENCOUNTER — Encounter (HOSPITAL_BASED_OUTPATIENT_CLINIC_OR_DEPARTMENT_OTHER): Payer: Self-pay

## 2021-06-18 ENCOUNTER — Emergency Department (HOSPITAL_BASED_OUTPATIENT_CLINIC_OR_DEPARTMENT_OTHER): Payer: BC Managed Care – PPO

## 2021-06-18 ENCOUNTER — Other Ambulatory Visit: Payer: Self-pay

## 2021-06-18 DIAGNOSIS — Z7982 Long term (current) use of aspirin: Secondary | ICD-10-CM | POA: Diagnosis not present

## 2021-06-18 DIAGNOSIS — W540XXD Bitten by dog, subsequent encounter: Secondary | ICD-10-CM

## 2021-06-18 DIAGNOSIS — I714 Abdominal aortic aneurysm, without rupture, unspecified: Secondary | ICD-10-CM

## 2021-06-18 DIAGNOSIS — I10 Essential (primary) hypertension: Secondary | ICD-10-CM | POA: Diagnosis not present

## 2021-06-18 DIAGNOSIS — Y990 Civilian activity done for income or pay: Secondary | ICD-10-CM | POA: Diagnosis not present

## 2021-06-18 DIAGNOSIS — Z4801 Encounter for change or removal of surgical wound dressing: Secondary | ICD-10-CM | POA: Insufficient documentation

## 2021-06-18 DIAGNOSIS — Z79899 Other long term (current) drug therapy: Secondary | ICD-10-CM | POA: Insufficient documentation

## 2021-06-18 DIAGNOSIS — Z87891 Personal history of nicotine dependence: Secondary | ICD-10-CM | POA: Diagnosis not present

## 2021-06-18 DIAGNOSIS — L03311 Cellulitis of abdominal wall: Secondary | ICD-10-CM | POA: Diagnosis not present

## 2021-06-18 DIAGNOSIS — Z5189 Encounter for other specified aftercare: Secondary | ICD-10-CM

## 2021-06-18 DIAGNOSIS — S31159D Open bite of abdominal wall, unspecified quadrant without penetration into peritoneal cavity, subsequent encounter: Secondary | ICD-10-CM | POA: Insufficient documentation

## 2021-06-18 DIAGNOSIS — L0390 Cellulitis, unspecified: Secondary | ICD-10-CM

## 2021-06-18 MED ORDER — AMOXICILLIN-POT CLAVULANATE 875-125 MG PO TABS: 1.0000 | ORAL_TABLET | Freq: Two times a day (BID) | ORAL | 0 refills | Status: DC

## 2021-06-18 MED ORDER — CEFTRIAXONE SODIUM 500 MG IJ SOLR
250.0000 mg | Freq: Once | INTRAMUSCULAR | Status: AC
  Administered 2021-06-18: 250 mg via INTRAMUSCULAR

## 2021-06-18 MED ORDER — CEPHALEXIN 500 MG PO CAPS: 500.0000 mg | ORAL_CAPSULE | Freq: Three times a day (TID) | ORAL | 0 refills | Status: DC

## 2021-06-18 MED ORDER — ACETAMINOPHEN 325 MG PO TABS: 650.0000 mg | ORAL_TABLET | Freq: Four times a day (QID) | ORAL | 0 refills | Status: DC | PRN

## 2021-06-18 NOTE — ED Triage Notes (Signed)
Pt states dog bite to left abd 9/8 worse "feels like a knot there"-NAD-steady gait

## 2021-06-18 NOTE — ED Provider Notes (Addendum)
[facility] EMERGENCY DEPARTMENT Provider Note   CSN: [id number] Arrival date & time: 06/18/21  1610     History Chief Complaint  Patient presents with   Wound Check    Wayne Robinson is a 65 y.o. male.  Pt is a 65 yo male presenting for dog bite. Patient states he was attacked and bit by dog approx 4 days ago on 9/8 on left abdomen. States he was seen at that time but didn't start antibiotics. Pt presenting today for new bruising, pain, and swelling of abdomen. Denies fevers, chills, or drainage from wounds.   The history is provided by the patient. No language interpreter was used.  Wound Check Associated symptoms include abdominal pain. Pertinent negatives include no chest pain and no shortness of breath.      Past Medical History:  Diagnosis Date   High cholesterol    Hypertension     There are no problems to display for this patient.   History reviewed. No pertinent surgical history.     Family History  Problem Relation Age of Onset   Cancer Mother     Social History   Tobacco Use   Smoking status: Former    Types: Cigarettes    Quit date: 10/12/2015    Years since quitting: 5.6   Smokeless tobacco: Never  Vaping Use   Vaping Use: Never used  Substance Use Topics   Alcohol use: Yes    Comment: very seldom   Drug use: Never    Home Medications Prior to Admission medications   Medication Sig Start Date End Date Taking? Authorizing Provider  acetaminophen (TYLENOL) 325 MG tablet Take 2 tablets (650 mg total) by mouth every 6 (six) hours as needed. 06/18/21  Yes Edwin Dada P, DO  amoxicillin-clavulanate (AUGMENTIN) 875-125 MG tablet Take 1 tablet by mouth every 12 (twelve) hours. 06/18/21  Yes Edwin Dada P, DO  albuterol (VENTOLIN HFA) 108 (90 Base) MCG/ACT inhaler Inhale 1-2 puffs into the lungs every 6 (six) hours as needed for wheezing or shortness of breath. 10/12/19   Blue, Olivia C, PA-C  amLODipine (NORVASC) 5 MG tablet Take 5 mg by  mouth daily.    [provider]  amLODipine-atorvastatin (CADUET) 5-10 MG tablet Take 1 tablet by mouth daily.    [provider]  aspirin EC 81 MG tablet Take 81 mg by mouth daily.    [provider]  atorvastatin (LIPITOR) 20 MG tablet Take 20 mg by mouth daily.    [provider]  fexofenadine (ALLEGRA ALLERGY) 180 MG tablet Take 1 tablet (180 mg total) by mouth daily. 02/13/21 03/15/21  Trevor Iha, FNP  ibuprofen (ADVIL) 800 MG tablet Take 1 tablet (800 mg total) by mouth 3 (three) times daily. 02/13/21   Trevor Iha, FNP  losartan (COZAAR) 50 MG tablet Take 50 mg by mouth daily.    [provider]  meclizine (ANTIVERT) 25 MG tablet Take 1 tablet (25 mg total) by mouth 3 (three) times daily as needed for dizziness. 03/29/20   Cristina Gong, PA-C    Allergies    Gabapentin  Review of Systems   Review of Systems  Constitutional:  Negative for chills and fever.  HENT:  Negative for ear pain and sore throat.   Eyes:  Negative for pain and visual disturbance.  Respiratory:  Negative for cough and shortness of breath.   Cardiovascular:  Negative for chest pain and palpitations.  Gastrointestinal:  Positive for abdominal pain. Negative  for vomiting.  Genitourinary:  Negative for dysuria and hematuria.  Musculoskeletal:  Negative for arthralgias and back pain.  Skin:  Positive for wound. Negative for color change and rash.  Neurological:  Negative for seizures and syncope.  All other systems reviewed and are negative.  Physical Exam Updated Vital Signs BP (!) 158/93 (BP Location: Right Arm)   Pulse (!) 51   Temp 98.3 F (36.8 C) (Oral)   Resp 16   SpO2 98%   Physical Exam Vitals and nursing note reviewed.  Constitutional:      Appearance: He is well-developed.  HENT:     Head: Normocephalic and atraumatic.  Eyes:     Conjunctiva/sclera: Conjunctivae normal.  Cardiovascular:     Rate and Rhythm: Normal rate and regular  rhythm.     Heart sounds: No murmur heard. Pulmonary:     Effort: Pulmonary effort is normal. No respiratory distress.     Breath sounds: Normal breath sounds.  Abdominal:     Palpations: Abdomen is soft.     Tenderness: There is no abdominal tenderness.    Musculoskeletal:     Cervical back: Neck supple.  Skin:    General: Skin is warm and dry.  Neurological:     Mental Status: He is alert.    ED Results / Procedures / Treatments   Labs (all labs ordered are listed, but only abnormal results are displayed) Labs Reviewed - No data to display  EKG None  Radiology CT ABDOMEN PELVIS WO CONTRAST  Result Date: 06/18/2021 CLINICAL DATA:  Status post dog bite to the abdomen on September 9th with new pain and swelling. EXAM: CT ABDOMEN AND PELVIS WITHOUT CONTRAST TECHNIQUE: Multidetector CT imaging of the abdomen and pelvis was performed following the standard protocol without IV contrast. COMPARISON:  None. FINDINGS: Lower chest: No acute abnormality. Hepatobiliary: A 2.2 cm diameter cyst is seen within the left lobe of the liver. A thin layer of tiny gallstones is seen within the gallbladder lumen without gallbladder wall thickening or biliary dilatation. Pancreas: Unremarkable. No pancreatic ductal dilatation or surrounding inflammatory changes. Spleen: Normal in size without focal abnormality. Adrenals/Urinary Tract: The right adrenal gland is unremarkable. A 1.1 cm x 0.8 cm low-attenuation left adrenal mass is noted. Kidneys are normal, without renal calculi, focal lesion, or hydronephrosis. Bladder is unremarkable. Stomach/Bowel: Stomach is within normal limits. Appendix appears normal. No evidence of bowel wall thickening, distention, or inflammatory changes. Numerous noninflamed diverticula are seen within the descending and sigmoid colon. Vascular/Lymphatic: Aortic atherosclerosis. Approximately 3.2 cm x 2.7 cm aneurysmal dilatation of the infrarenal abdominal aorta is noted. No  enlarged abdominal or pelvic lymph nodes. Reproductive: Prostate is unremarkable. Other: Mild subcutaneous inflammatory fat stranding is seen along the anteromedial aspect of the mid left abdominal wall. There is no evidence of associated fluid collection or abscess. No soft tissue air is seen. No abdominopelvic ascites. Musculoskeletal: No acute or significant osseous findings. IMPRESSION: 1. Mild cellulitis versus very small subcutaneous hematoma along the anteromedial aspect of the mid left abdominal wall. 2. Cholelithiasis without evidence of acute cholecystitis. 3. Colonic diverticulosis. 4. Approximately 3.2 cm x 2.7 cm infrarenal abdominal aortic aneurysm. 5. 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 Correlation with adrenal protocol CT is recommended. This recommendation follows ACR consensus guidelines: Management of Incidental Adrenal Masses: A White Paper of the ACR Incidental Findings Committee. J Am Coll Radiol 2017;14:1038-1044. 6. Aortic atherosclerosis. Aortic Atherosclerosis (ICD10-I70.0). Electronically Signed   By: Waylan Rocher  [place] M.D.   On: 06/18/2021 20:27    Procedures Procedures   Medications Ordered in ED Medications  cefTRIAXone (ROCEPHIN) injection 250 mg (250 mg Intramuscular Given 06/18/21 2156)    ED Course  I have reviewed the triage vital signs and the nursing notes.  Pertinent labs & imaging results that were available during my care of the patient were reviewed by me and considered in my medical decision making (see chart for details).    MDM Rules/Calculators/A&P                           5:09 PM 65 yo male presenting for dog bite to abdomen that occurred 4 days ago. Pt is Aox3, no 4 puncture wounds with erythema. No swelling. Significant ecchymosis on abdomen. Minimally tender to palpation. No guarding, distension, or rigidity.   -No signs/symptoms of sepsis -CT abdomen demonstrates soft tissue cellulitis and/or hematoma.  Augmentin given. -Pt declined medication for pain control. -Incidental CT findings discussed with patient including aortic aneurysm and need for yearly f/u with pcp for monitoring. -Strict return precautions given for worsening abdominal pain/signs of infection.  Patient in no distress and overall condition improved here in the ED. Detailed discussions were had with the patient regarding current findings, and need for close f/u with PCP or on call doctor. The patient has been instructed to return immediately if the symptoms worsen in any way for re-evaluation. Patient verbalized understanding and is in agreement with current care plan. All questions answered prior to discharge.     Final Clinical Impression(s) / ED Diagnoses Final diagnoses:  Cellulitis, abdomen   Visit for wound check  Dog bite, subsequent encounter  Abdominal aortic aneurysm (AAA) without rupture (HCC)    Rx / DC Orders ED Discharge Orders          Ordered    cephALEXin (KEFLEX) 500 MG capsule  3 times daily,   Status:  Discontinued        06/18/21 2122    acetaminophen (TYLENOL) 325 MG tablet  Every 6 hours PRN        06/18/21 2124    amoxicillin-clavulanate (AUGMENTIN) 875-125 MG tablet  Every 12 hours        06/18/21 2133             Franne Forts, DO 06/19/21 1709    Franne Forts, DO 06/19/21 1710

## 2021-06-22 DIAGNOSIS — R5383 Other fatigue: Secondary | ICD-10-CM | POA: Diagnosis not present

## 2021-06-22 DIAGNOSIS — R509 Fever, unspecified: Secondary | ICD-10-CM | POA: Diagnosis not present

## 2021-06-22 DIAGNOSIS — R051 Acute cough: Secondary | ICD-10-CM | POA: Diagnosis not present

## 2021-06-22 DIAGNOSIS — U071 COVID-19: Secondary | ICD-10-CM | POA: Diagnosis not present

## 2021-07-05 ENCOUNTER — Other Ambulatory Visit: Payer: Self-pay | Admitting: Family Medicine

## 2021-07-05 DIAGNOSIS — E278 Other specified disorders of adrenal gland: Secondary | ICD-10-CM

## 2021-07-20 ENCOUNTER — Other Ambulatory Visit: Payer: Self-pay

## 2021-07-20 ENCOUNTER — Ambulatory Visit
Admission: RE | Admit: 2021-07-20 | Discharge: 2021-07-20 | Disposition: A | Discharge status: Home / Self Care | Payer: BC Managed Care – PPO | Source: Ambulatory Visit | Attending: Family Medicine | Admitting: Family Medicine

## 2021-07-20 DIAGNOSIS — D3502 Benign neoplasm of left adrenal gland: Secondary | ICD-10-CM | POA: Diagnosis not present

## 2021-07-20 DIAGNOSIS — E278 Other specified disorders of adrenal gland: Secondary | ICD-10-CM

## 2021-07-20 DIAGNOSIS — I7133 Infrarenal abdominal aortic aneurysm, ruptured: Secondary | ICD-10-CM | POA: Diagnosis not present

## 2021-07-20 DIAGNOSIS — K8020 Calculus of gallbladder without cholecystitis without obstruction: Secondary | ICD-10-CM | POA: Diagnosis not present

## 2021-07-20 MED ORDER — IOPAMIDOL (ISOVUE-300) INJECTION 61%
100.0000 mL | Freq: Once | INTRAVENOUS | Status: AC | PRN
  Administered 2021-07-20: 100 mL via INTRAVENOUS

## 2021-08-16 ENCOUNTER — Emergency Department (HOSPITAL_BASED_OUTPATIENT_CLINIC_OR_DEPARTMENT_OTHER): Payer: BC Managed Care – PPO

## 2021-08-16 ENCOUNTER — Other Ambulatory Visit: Payer: Self-pay

## 2021-08-16 ENCOUNTER — Emergency Department (HOSPITAL_BASED_OUTPATIENT_CLINIC_OR_DEPARTMENT_OTHER)
Admission: EM | Admit: 2021-08-16 | Discharge: 2021-08-16 | Disposition: A | Discharge status: Home / Self Care | Payer: BC Managed Care – PPO | Attending: Emergency Medicine | Admitting: Emergency Medicine

## 2021-08-16 ENCOUNTER — Encounter (HOSPITAL_BASED_OUTPATIENT_CLINIC_OR_DEPARTMENT_OTHER): Payer: Self-pay | Admitting: Emergency Medicine

## 2021-08-16 DIAGNOSIS — I10 Essential (primary) hypertension: Secondary | ICD-10-CM | POA: Diagnosis not present

## 2021-08-16 DIAGNOSIS — Z79899 Other long term (current) drug therapy: Secondary | ICD-10-CM | POA: Diagnosis not present

## 2021-08-16 DIAGNOSIS — Z7982 Long term (current) use of aspirin: Secondary | ICD-10-CM | POA: Diagnosis not present

## 2021-08-16 DIAGNOSIS — R42 Dizziness and giddiness: Secondary | ICD-10-CM | POA: Diagnosis not present

## 2021-08-16 DIAGNOSIS — R55 Syncope and collapse: Secondary | ICD-10-CM | POA: Diagnosis not present

## 2021-08-16 DIAGNOSIS — R110 Nausea: Secondary | ICD-10-CM | POA: Diagnosis not present

## 2021-08-16 DIAGNOSIS — Z87891 Personal history of nicotine dependence: Secondary | ICD-10-CM | POA: Diagnosis not present

## 2021-08-16 LAB — URINALYSIS, ROUTINE W REFLEX MICROSCOPIC
Bilirubin Urine: NEGATIVE
Glucose, UA: NEGATIVE mg/dL
Hgb urine dipstick: NEGATIVE
Ketones, ur: NEGATIVE mg/dL
Leukocytes,Ua: NEGATIVE
Nitrite: NEGATIVE
Protein, ur: NEGATIVE mg/dL
Specific Gravity, Urine: 1.015 (ref 1.005–1.030)
pH: 7 (ref 5.0–8.0)

## 2021-08-16 LAB — BASIC METABOLIC PANEL
Anion gap: 8 (ref 5–15)
BUN: 17 mg/dL (ref 8–23)
CO2: 29 mmol/L (ref 22–32)
Calcium: 9.3 mg/dL (ref 8.9–10.3)
Chloride: 102 mmol/L (ref 98–111)
Creatinine, Ser: 1.04 mg/dL (ref 0.61–1.24)
GFR, Estimated: >60 mL/min (ref >60)
Glucose, Bld: 104 mg/dL — ABNORMAL HIGH (ref 70–99)
Potassium: 3.8 mmol/L (ref 3.5–5.1)
Sodium: 139 mmol/L (ref 135–145)

## 2021-08-16 LAB — CBC
HCT: 44.5 % (ref 39.0–52.0)
Hemoglobin: 15 g/dL (ref 13.0–17.0)
MCH: 30.6 pg (ref 26.0–34.0)
MCHC: 33.7 g/dL (ref 30.0–36.0)
MCV: 90.8 fL (ref 80.0–100.0)
Platelets: 225 10*3/uL (ref 150–400)
RBC: 4.9 MIL/uL (ref 4.22–5.81)
RDW: 13.4 % (ref 11.5–15.5)
WBC: 5.7 10*3/uL (ref 4.0–10.5)
nRBC: 0 % (ref 0.0–0.2)

## 2021-08-16 MED ORDER — MECLIZINE HCL 25 MG PO TABS
25.0000 mg | ORAL_TABLET | Freq: Once | ORAL | Status: AC
  Administered 2021-08-16: 25 mg via ORAL
  Filled 2021-08-16: qty 1

## 2021-08-16 MED ORDER — ONDANSETRON HCL 4 MG/2ML IJ SOLN
4.0000 mg | Freq: Once | INTRAMUSCULAR | Status: AC
  Administered 2021-08-16: 4 mg via INTRAVENOUS
  Filled 2021-08-16: qty 2

## 2021-08-16 MED ORDER — LORAZEPAM 2 MG/ML IJ SOLN
1.0000 mg | Freq: Once | INTRAMUSCULAR | Status: AC
  Administered 2021-08-16: 1 mg via INTRAVENOUS
  Filled 2021-08-16: qty 1

## 2021-08-16 MED ORDER — DIAZEPAM 5 MG PO TABS
5.0000 mg | ORAL_TABLET | Freq: Two times a day (BID) | ORAL | 0 refills | Status: AC
Start: 2021-08-16 — End: ?

## 2021-08-16 MED ORDER — SODIUM CHLORIDE 0.9 % IV BOLUS
1000.0000 mL | Freq: Once | INTRAVENOUS | Status: AC
  Administered 2021-08-16: 1000 mL via INTRAVENOUS

## 2021-08-16 MED ORDER — MECLIZINE HCL 25 MG PO TABS: 25.0000 mg | ORAL_TABLET | Freq: Three times a day (TID) | ORAL | 0 refills | Status: DC | PRN

## 2021-08-16 MED ORDER — ONDANSETRON 4 MG PO TBDP: 4.0000 mg | ORAL_TABLET | Freq: Three times a day (TID) | ORAL | 0 refills | Status: DC | PRN

## 2021-08-16 NOTE — ED Notes (Signed)
Pt ambulated . Reports decrease dizziness

## 2021-08-16 NOTE — ED Triage Notes (Signed)
Dizziness this am , while rolling in bed . Nausea . Hx vertigo 15 yrs ago . Also Hx HTN. Went to walking clinic , sent here for further evaluation .

## 2021-08-16 NOTE — ED Provider Notes (Signed)
[facility] EMERGENCY DEPARTMENT Provider Note   CSN: [id number] Arrival date & time: 08/16/21  1207     History Chief Complaint  Patient presents with   Dizziness    Wayne Robinson is a 65 y.o. male.  Pt presents to the ED today with dizziness.  Pt said it started after her turned his head to turn off his alarm clock.  The pt said he gets very dizzy when he moves his head.  He has had vertigo in the past.  He initially went to UC who sent him in to get a CT of his head.        Past Medical History:  Diagnosis Date   High cholesterol    Hypertension     There are no problems to display for this patient.   History reviewed. No pertinent surgical history.     Family History  Problem Relation Age of Onset   Cancer Mother     Social History   Tobacco Use   Smoking status: Former    Types: Cigarettes    Quit date: 10/12/2015    Years since quitting: 5.8   Smokeless tobacco: Never  Vaping Use   Vaping Use: Never used  Substance Use Topics   Alcohol use: Yes    Comment: very seldom   Drug use: Never    Home Medications Prior to Admission medications   Medication Sig Start Date End Date Taking? Authorizing Provider  diazepam (VALIUM) 5 MG tablet Take 1 tablet (5 mg total) by mouth 2 (two) times daily. 08/16/21  Yes Jacalyn Lefevre, MD  meclizine (ANTIVERT) 25 MG tablet Take 1 tablet (25 mg total) by mouth 3 (three) times daily as needed for dizziness. 08/16/21  Yes Jacalyn Lefevre, MD  ondansetron (ZOFRAN ODT) 4 MG disintegrating tablet Take 1 tablet (4 mg total) by mouth every 8 (eight) hours as needed for nausea or vomiting. 08/16/21  Yes Jacalyn Lefevre, MD  acetaminophen (TYLENOL) 325 MG tablet Take 2 tablets (650 mg total) by mouth every 6 (six) hours as needed. 06/18/21   Edwin Dada P, DO  albuterol (VENTOLIN HFA) 108 (90 Base) MCG/ACT inhaler Inhale 1-2 puffs into the lungs every 6 (six) hours as needed for wheezing or shortness of breath.  10/12/19   Blue, Olivia C, PA-C  amLODipine (NORVASC) 5 MG tablet Take 5 mg by mouth daily.    [provider]  amLODipine-atorvastatin (CADUET) 5-10 MG tablet Take 1 tablet by mouth daily.    [provider]  amoxicillin-clavulanate (AUGMENTIN) 875-125 MG tablet Take 1 tablet by mouth every 12 (twelve) hours. 06/18/21   Franne Forts, DO  aspirin EC 81 MG tablet Take 81 mg by mouth daily.    [provider]  atorvastatin (LIPITOR) 20 MG tablet Take 20 mg by mouth daily.    [provider]  fexofenadine (ALLEGRA ALLERGY) 180 MG tablet Take 1 tablet (180 mg total) by mouth daily. 02/13/21 03/15/21  Trevor Iha, FNP  ibuprofen (ADVIL) 800 MG tablet Take 1 tablet (800 mg total) by mouth 3 (three) times daily. 02/13/21   Trevor Iha, FNP  losartan (COZAAR) 50 MG tablet Take 50 mg by mouth daily.    [provider]    Allergies    Gabapentin  Review of Systems   Review of Systems  Neurological:  Positive for dizziness.  All other systems reviewed and are negative.  Physical Exam Updated Vital Signs BP 127/77   Pulse (!) 52  Temp 98.2 F (36.8 C) (Oral)   Resp 17   Ht 6' (1.829 m)   Wt 83.9 kg   SpO2 96%   BMI 25.09 kg/m   Physical Exam Vitals and nursing note reviewed.  Constitutional:      Appearance: Normal appearance.  HENT:     Head: Normocephalic and atraumatic.     Right Ear: External ear normal.     Left Ear: External ear normal.     Nose: Nose normal.     Mouth/Throat:     Mouth: Mucous membranes are moist.     Pharynx: Oropharynx is clear.  Eyes:     Extraocular Movements: Extraocular movements intact.     Conjunctiva/sclera: Conjunctivae normal.     Pupils: Pupils are equal, round, and reactive to light.  Cardiovascular:     Rate and Rhythm: Normal rate and regular rhythm.     Pulses: Normal pulses.     Heart sounds: Normal heart sounds.  Pulmonary:     Effort: Pulmonary effort is normal.     Breath sounds:  Normal breath sounds.  Abdominal:     General: Abdomen is flat. Bowel sounds are normal.     Palpations: Abdomen is soft.  Musculoskeletal:        General: Normal range of motion.     Cervical back: Normal range of motion and neck supple.  Skin:    General: Skin is warm.     Capillary Refill: Capillary refill takes less than 2 seconds.  Neurological:     General: No focal deficit present.     Mental Status: He is alert and oriented to person, place, and time.  Psychiatric:        Mood and Affect: Mood normal.        Behavior: Behavior normal.    ED Results / Procedures / Treatments   Labs (all labs ordered are listed, but only abnormal results are displayed) Labs Reviewed  BASIC METABOLIC PANEL - Abnormal; Notable for the following components:      Result Value   Glucose, Bld 104 (*)    All other components within normal limits  CBC  URINALYSIS, ROUTINE W REFLEX MICROSCOPIC  CBG MONITORING, ED    EKG EKG Interpretation  Date/Time:  Thursday August 16 2021 12:33:41 EST Ventricular Rate:  59 PR Interval:  140 QRS Duration: 88 QT Interval:  412 QTC Calculation: 407 R Axis:   52 Text Interpretation: Sinus bradycardia Otherwise normal ECG No significant change since last tracing Confirmed by Jacalyn Lefevre [phone]) on 08/16/2021 12:49:30 PM  Radiology CT Head Wo Contrast  Result Date: 08/16/2021 CLINICAL DATA:  65 year old male presenting with history of dizziness and nausea, remote history of vertigo by report. EXAM: CT HEAD WITHOUT CONTRAST TECHNIQUE: Contiguous axial images were obtained from the base of the skull through the vertex without intravenous contrast. COMPARISON:  March 29, 2020. FINDINGS: Brain: No evidence of acute infarction, hemorrhage, hydrocephalus, extra-axial collection or mass lesion/mass effect. Vascular: No hyperdense vessel or unexpected calcification. Skull: Normal. Negative for fracture or focal lesion. Sinuses/Orbits: Visualized paranasal  sinuses and orbits are unremarkable. Other: None. IMPRESSION: No acute intracranial abnormality. Electronically Signed   By: Donzetta Kohut M.D.   On: 08/16/2021 12:54    Procedures Procedures   Medications Ordered in ED Medications  LORazepam (ATIVAN) injection 1 mg (1 mg Intravenous Given 08/16/21 1401)  ondansetron (ZOFRAN) injection 4 mg (4 mg Intravenous Given 08/16/21 1400)  sodium chloride 0.9 % bolus 1,000 mL (  1,000 mLs Intravenous New Bag/Given 08/16/21 1400)  meclizine (ANTIVERT) tablet 25 mg (25 mg Oral Given 08/16/21 1402)    ED Course  I have reviewed the triage vital signs and the nursing notes.  Pertinent labs & imaging results that were available during my care of the patient were reviewed by me and considered in my medical decision making (see chart for details).    MDM Rules/Calculators/A&P                           Pt is feeling better after meds.  The pt is still a little dizzy, but is able to ambulate.  Pt is stable for d/c.  Return if worse.    Final Clinical Impression(s) / ED Diagnoses Final diagnoses:  Vertigo    Rx / DC Orders ED Discharge Orders          Ordered    meclizine (ANTIVERT) 25 MG tablet  3 times daily PRN        08/16/21 1459    ondansetron (ZOFRAN ODT) 4 MG disintegrating tablet  Every 8 hours PRN        08/16/21 1459    diazepam (VALIUM) 5 MG tablet  2 times daily        08/16/21 1459             Jacalyn Lefevre, MD 08/16/21 1514

## 2021-08-16 NOTE — ED Notes (Signed)
Pt aware urine specimen ordered. Pt reports inability to provide specimen at this time. Specimen collection device provided to patient. 

## 2021-10-30 DIAGNOSIS — H8111 Benign paroxysmal vertigo, right ear: Secondary | ICD-10-CM | POA: Diagnosis not present

## 2022-02-02 DIAGNOSIS — K439 Ventral hernia without obstruction or gangrene: Secondary | ICD-10-CM | POA: Diagnosis not present

## 2022-02-22 DIAGNOSIS — M6208 Separation of muscle (nontraumatic), other site: Secondary | ICD-10-CM | POA: Diagnosis not present

## 2022-03-12 IMAGING — CT CT HEAD W/O CM
3 series · 15 of 47 positions shown, 18 images · non-contrast
Comparison: March 29, 2020.

CLINICAL DATA: 65-year-old male presenting with history of
dizziness and nausea, remote history of vertigo by report.

EXAM:
CT HEAD WITHOUT CONTRAST
TECHNIQUE: Contiguous axial images were obtained from the base of the skull
through the vertex without intravenous contrast.

[Series 2: head wo · axial · 0.42mm/px · z∈[-190,-65]mm · 9 of 31 slices shown, 12 images]
[im 3/31  brain]
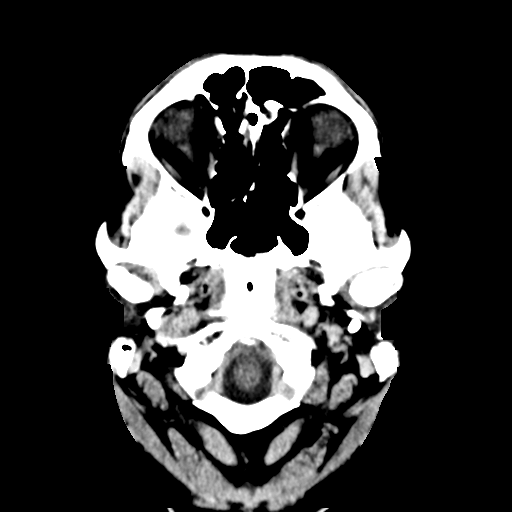
[im 3/31  bone]
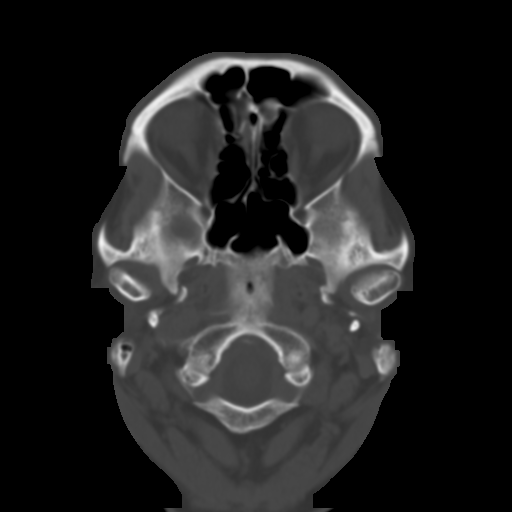
[im 6/31  brain]
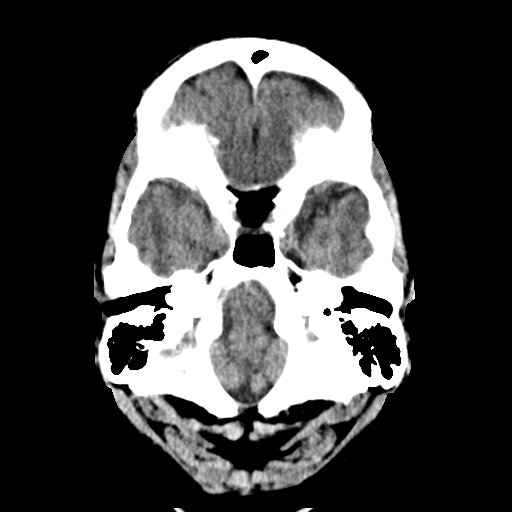
[im 9/31  brain]
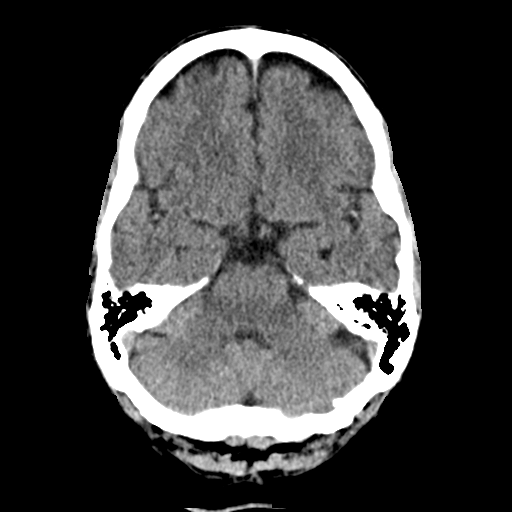
[im 12/31  brain]
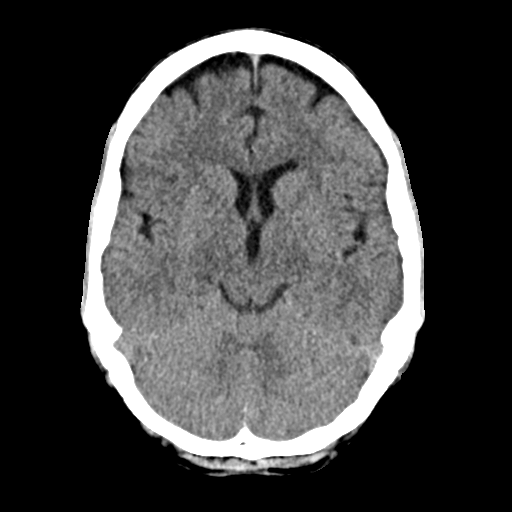
[im 16/31  brain]
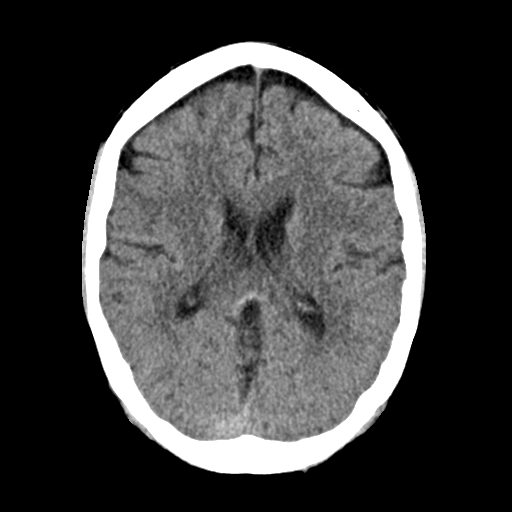
[im 16/31  bone]
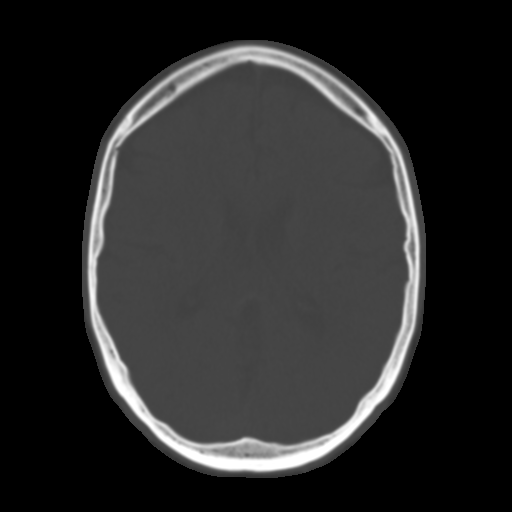
[im 19/31  brain]
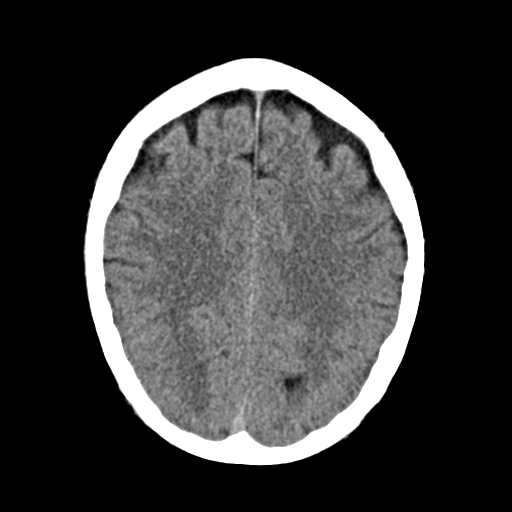
[im 22/31  brain]
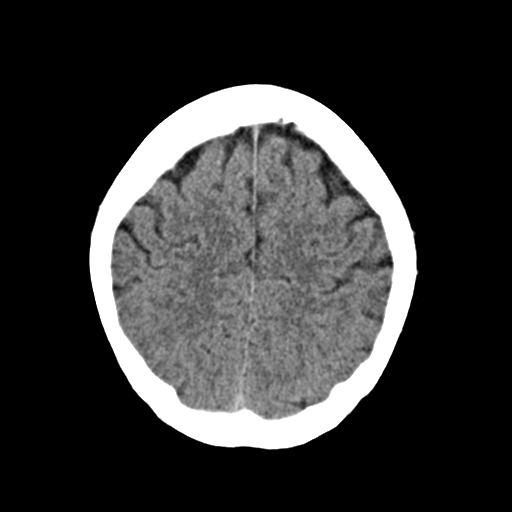
[im 25/31  brain]
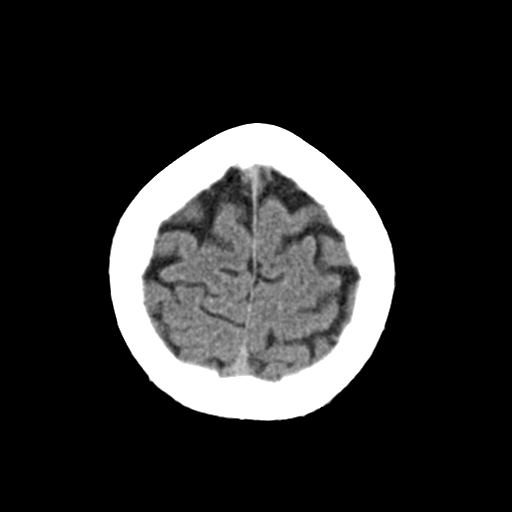
[im 28/31  brain]
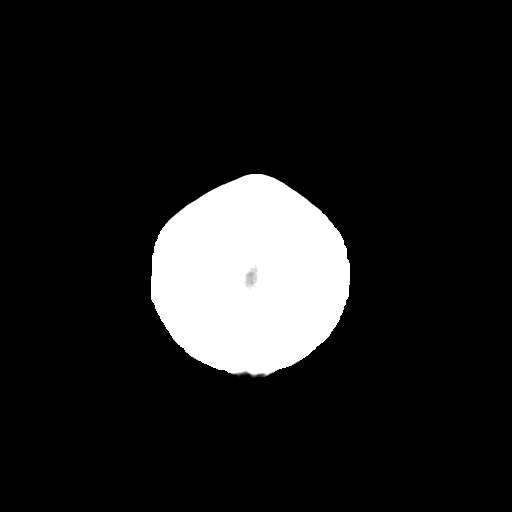
[im 28/31  bone]
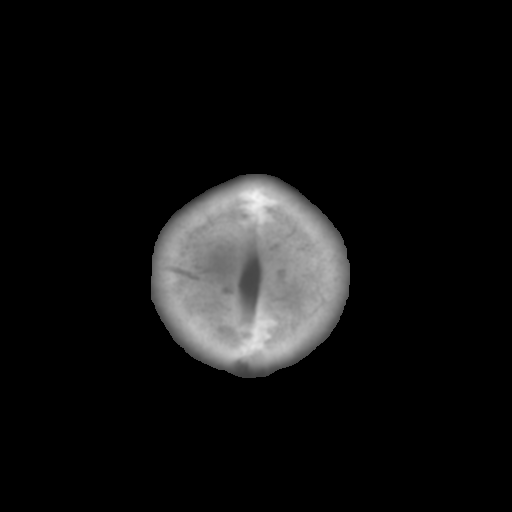

[Series 4: coronal soft · coronal · 0.29mm/px · 3 of 71 slices shown]
[im 24/71  brain]
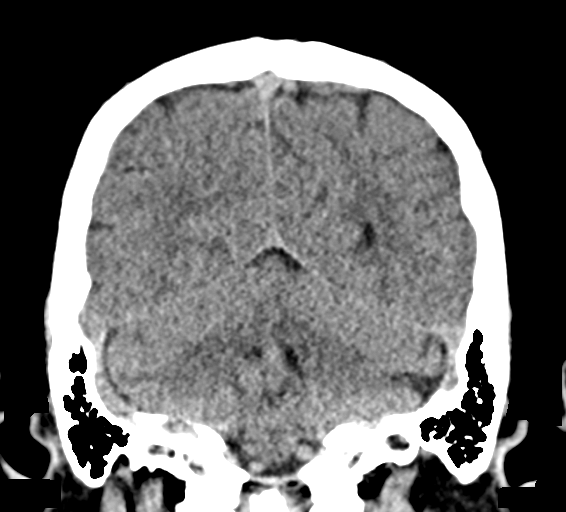
[im 32/71  brain]
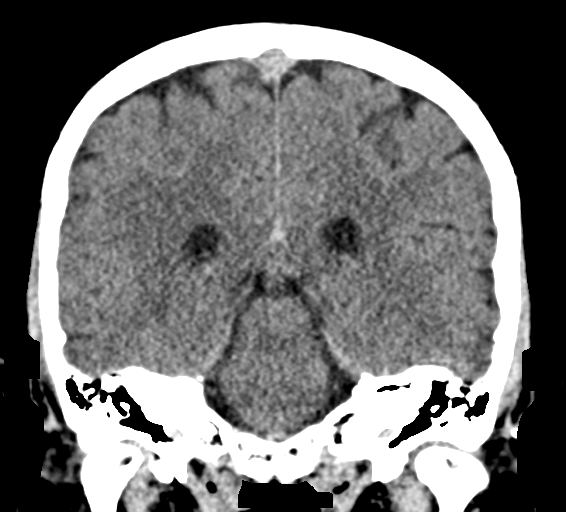
[im 39/71  brain]
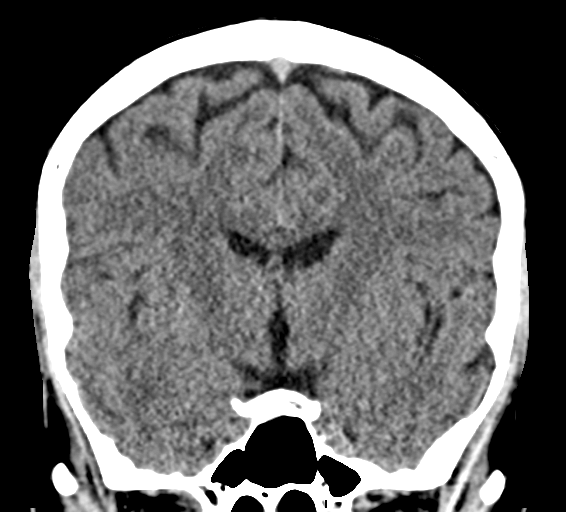

[Series 5: sag soft · sagittal · 0.29mm/px · 3 of 54 slices shown]
[im 18/54  brain]
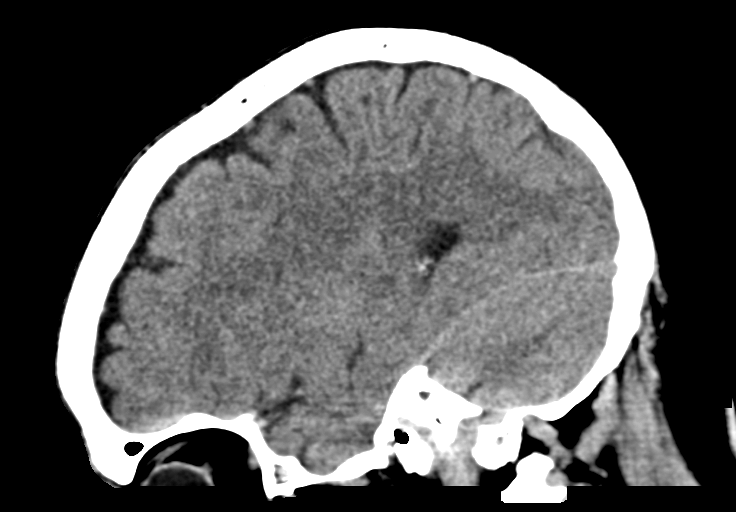
[im 27/54  brain]
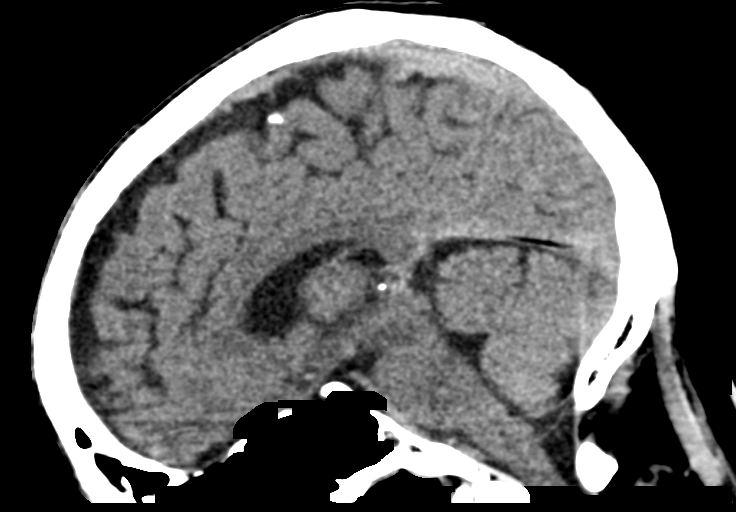
[im 36/54  brain]
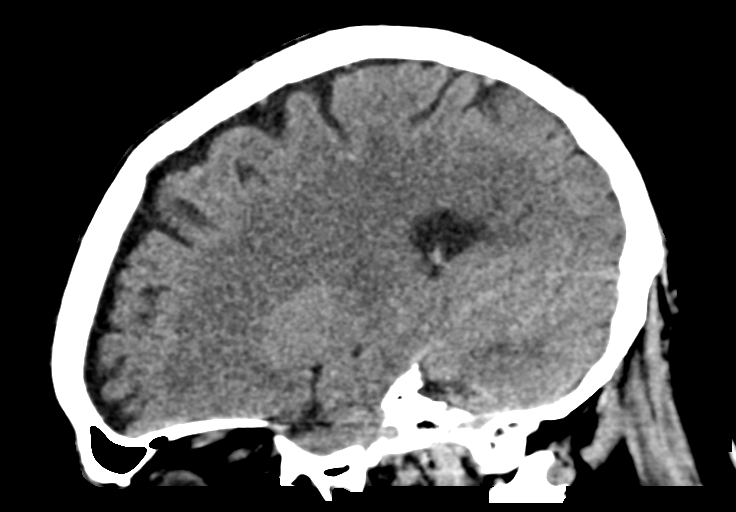

[15 of 47 positions shown; findings below may reference images not displayed]

FINDINGS: Brain: No evidence of acute infarction, hemorrhage, hydrocephalus,
extra-axial collection or mass lesion/mass effect.

Vascular: No hyperdense vessel or unexpected calcification.

Skull: Normal. Negative for fracture or focal lesion.

Sinuses/Orbits: Visualized paranasal sinuses and orbits are
unremarkable.

Other: None.
IMPRESSION: No acute intracranial abnormality.

## 2022-04-05 DIAGNOSIS — M6208 Separation of muscle (nontraumatic), other site: Secondary | ICD-10-CM | POA: Diagnosis not present

## 2022-04-05 DIAGNOSIS — F330 Major depressive disorder, recurrent, mild: Secondary | ICD-10-CM | POA: Diagnosis not present

## 2022-05-23 DIAGNOSIS — Z0000 Encounter for general adult medical examination without abnormal findings: Secondary | ICD-10-CM | POA: Diagnosis not present

## 2022-05-23 DIAGNOSIS — N529 Male erectile dysfunction, unspecified: Secondary | ICD-10-CM | POA: Diagnosis not present

## 2022-05-23 DIAGNOSIS — E782 Mixed hyperlipidemia: Secondary | ICD-10-CM | POA: Diagnosis not present

## 2022-05-23 DIAGNOSIS — I10 Essential (primary) hypertension: Secondary | ICD-10-CM | POA: Diagnosis not present

## 2022-05-23 DIAGNOSIS — Z125 Encounter for screening for malignant neoplasm of prostate: Secondary | ICD-10-CM | POA: Diagnosis not present

## 2022-05-23 DIAGNOSIS — F330 Major depressive disorder, recurrent, mild: Secondary | ICD-10-CM | POA: Diagnosis not present

## 2022-08-13 DIAGNOSIS — J018 Other acute sinusitis: Secondary | ICD-10-CM | POA: Diagnosis not present

## 2022-08-13 DIAGNOSIS — R42 Dizziness and giddiness: Secondary | ICD-10-CM | POA: Diagnosis not present

## 2022-08-26 DIAGNOSIS — J029 Acute pharyngitis, unspecified: Secondary | ICD-10-CM | POA: Diagnosis not present

## 2022-08-26 DIAGNOSIS — R110 Nausea: Secondary | ICD-10-CM | POA: Diagnosis not present

## 2022-08-26 DIAGNOSIS — R509 Fever, unspecified: Secondary | ICD-10-CM | POA: Diagnosis not present

## 2022-08-26 DIAGNOSIS — U071 COVID-19: Secondary | ICD-10-CM | POA: Diagnosis not present

## 2022-08-26 DIAGNOSIS — R051 Acute cough: Secondary | ICD-10-CM | POA: Diagnosis not present

## 2022-12-30 ENCOUNTER — Encounter: Payer: Self-pay | Admitting: *Deleted

## 2022-12-30 ENCOUNTER — Ambulatory Visit
Admission: EM | Admit: 2022-12-30 | Discharge: 2022-12-30 | Disposition: A | Discharge status: Home / Self Care | Payer: BC Managed Care – PPO

## 2022-12-30 DIAGNOSIS — I10 Essential (primary) hypertension: Secondary | ICD-10-CM

## 2022-12-30 DIAGNOSIS — H1131 Conjunctival hemorrhage, right eye: Secondary | ICD-10-CM | POA: Diagnosis not present

## 2022-12-30 NOTE — Discharge Instructions (Signed)
May use liquid tears if eye is uncomfortable See your eye doctor if this becomes worse, has increased pain, or decreased vision

## 2022-12-30 NOTE — ED Triage Notes (Addendum)
Patient reports feeling a dull pain in his right eye yesterday while driving. He latered noted redness in the outer area of his right eye. Denies any drainage, c/o mild dull pain. Wears glasses.

## 2023-03-19 NOTE — ED Provider Notes (Signed)
[facility]    CSN: [id number] Arrival date & time: 12/30/22  0854      History   Chief Complaint Chief Complaint  Patient presents with   Eye Problem    Right eye    HPI Wayne Robinson is a 67 y.o. male.   HPI  Patient said he had a dull pain in his right eye yesterday while driving.  No loss of vision.  No trauma.  No drainage or tearing.  Today it is red. Known hypertension that has been well controlled No headache  Past Medical History:  Diagnosis Date   High cholesterol    Hypertension     There are no problems to display for this patient.   History reviewed. No pertinent surgical history.     Home Medications    Prior to Admission medications   Medication Sig Start Date End Date Taking? Authorizing Provider  escitalopram (LEXAPRO) 20 MG tablet Take 20 mg by mouth daily.   Yes [provider]  amLODipine (NORVASC) 5 MG tablet Take 5 mg by mouth daily.    [provider]  amLODipine-atorvastatin (CADUET) 5-10 MG tablet Take 1 tablet by mouth daily.    [provider]  aspirin EC 81 MG tablet Take 81 mg by mouth daily.    [provider]  atorvastatin (LIPITOR) 20 MG tablet Take 20 mg by mouth daily.    [provider]  diazepam (VALIUM) 5 MG tablet Take 1 tablet (5 mg total) by mouth 2 (two) times daily. 08/16/21   Jacalyn Lefevre, MD  losartan (COZAAR) 50 MG tablet Take 50 mg by mouth daily.    [provider]    Family History Family History  Problem Relation Age of Onset   Cancer Mother     Social History Social History   Tobacco Use   Smoking status: Former    Types: Cigarettes    Quit date: 10/12/2015    Years since quitting: 7.4   Smokeless tobacco: Never  Vaping Use   Vaping Use: Never used  Substance Use Topics   Alcohol use: Yes    Comment: very seldom   Drug use: Never     Allergies   Gabapentin   Review of Systems Review of Systems See HPI  Physical  Exam Triage Vital Signs ED Triage Vitals  Enc Vitals Group     BP 12/30/22 0912 (!) 147/91     Pulse Rate 12/30/22 0912 68     Resp 12/30/22 0912 16     Temp 12/30/22 0912 97.8 F (36.6 C)     Temp Source 12/30/22 0912 Oral     SpO2 12/30/22 0912 97 %     Weight 12/30/22 0907 190 lb (86.2 kg)     Height 12/30/22 0907 6' (1.829 m)     Head Circumference --      Peak Flow --      Pain Score 12/30/22 0906 3     Pain Loc --      Pain Edu? --      Excl. in GC? --    No data found.  Updated Vital Signs BP (!) 147/91 (BP Location: Left Arm)   Pulse 68   Temp 97.8 F (36.6 C) (Oral)   Resp 16   Ht 6' (1.829 m)   Wt 86.2 kg   SpO2 97%   BMI 25.77 kg/m   Visual Acuity Right Eye Distance: 20/20 Left Eye Distance: 2015 Bilateral Distance: 20/15 (  corrected)     Physical Exam Constitutional:      General: He is not in acute distress.    Appearance: He is well-developed.  HENT:     Head: Normocephalic and atraumatic.  Eyes:     Extraocular Movements: Extraocular movements intact.     Conjunctiva/sclera: Conjunctivae normal.     Pupils: Pupils are equal, round, and reactive to light.     Funduscopic exam:    Right eye: No hemorrhage, exudate or AV nicking.        Left eye: No hemorrhage, exudate or AV nicking.   Cardiovascular:     Rate and Rhythm: Normal rate.  Pulmonary:     Effort: Pulmonary effort is normal. No respiratory distress.  Abdominal:     General: There is no distension.     Palpations: Abdomen is soft.  Musculoskeletal:        General: Normal range of motion.     Cervical back: Normal range of motion.  Skin:    General: Skin is warm and dry.  Neurological:     Mental Status: He is alert.      UC Treatments / Results  Labs (all labs ordered are listed, but only abnormal results are displayed) Labs Reviewed - No data to display  EKG   Radiology No results found.  Procedures Procedures (including critical care time)  Medications  Ordered in UC Medications - No data to display  Initial Impression / Assessment and Plan / UC Course  I have reviewed the triage vital signs and the nursing notes.  Pertinent labs & imaging results that were available during my care of the patient were reviewed by me and considered in my medical decision making (see chart for details).     Discussed benign nature of illness. Final Clinical Impressions(s) / UC Diagnoses   Final diagnoses:  Subconjunctival hemorrhage of right eye  Essential hypertension, benign     Discharge Instructions      May use liquid tears if eye is uncomfortable See your eye doctor if this becomes worse, has increased pain, or decreased vision    ED Prescriptions   None    PDMP not reviewed this encounter.   Eustace Moore, MD 03/19/23 [phone]

## 2023-04-15 DIAGNOSIS — R112 Nausea with vomiting, unspecified: Secondary | ICD-10-CM | POA: Diagnosis not present

## 2023-04-15 DIAGNOSIS — R509 Fever, unspecified: Secondary | ICD-10-CM | POA: Diagnosis not present

## 2023-04-15 DIAGNOSIS — R0989 Other specified symptoms and signs involving the circulatory and respiratory systems: Secondary | ICD-10-CM | POA: Diagnosis not present

## 2023-07-03 DIAGNOSIS — I7143 Infrarenal abdominal aortic aneurysm, without rupture: Secondary | ICD-10-CM | POA: Diagnosis not present

## 2023-07-03 DIAGNOSIS — Z125 Encounter for screening for malignant neoplasm of prostate: Secondary | ICD-10-CM | POA: Diagnosis not present

## 2023-07-03 DIAGNOSIS — F330 Major depressive disorder, recurrent, mild: Secondary | ICD-10-CM | POA: Diagnosis not present

## 2023-07-03 DIAGNOSIS — Z23 Encounter for immunization: Secondary | ICD-10-CM | POA: Diagnosis not present

## 2023-07-03 DIAGNOSIS — E782 Mixed hyperlipidemia: Secondary | ICD-10-CM | POA: Diagnosis not present

## 2023-07-03 DIAGNOSIS — I700 Atherosclerosis of aorta: Secondary | ICD-10-CM | POA: Diagnosis not present

## 2023-07-03 DIAGNOSIS — R1013 Epigastric pain: Secondary | ICD-10-CM | POA: Diagnosis not present

## 2023-07-03 DIAGNOSIS — Z0000 Encounter for general adult medical examination without abnormal findings: Secondary | ICD-10-CM | POA: Diagnosis not present

## 2023-08-15 ENCOUNTER — Other Ambulatory Visit: Payer: Self-pay

## 2023-08-15 DIAGNOSIS — Z87891 Personal history of nicotine dependence: Secondary | ICD-10-CM

## 2023-08-15 DIAGNOSIS — F17210 Nicotine dependence, cigarettes, uncomplicated: Secondary | ICD-10-CM

## 2023-08-15 DIAGNOSIS — Z122 Encounter for screening for malignant neoplasm of respiratory organs: Secondary | ICD-10-CM

## 2023-08-20 ENCOUNTER — Ambulatory Visit (INDEPENDENT_AMBULATORY_CARE_PROVIDER_SITE_OTHER): Payer: BC Managed Care – PPO | Admitting: Acute Care

## 2023-08-20 DIAGNOSIS — Z87891 Personal history of nicotine dependence: Secondary | ICD-10-CM

## 2023-08-20 NOTE — Progress Notes (Addendum)
 Provider Attestation I agree with the documentation of the Shared Decision Making visit,  smoking cessation counseling if appropriate, and verification or eligibility for lung cancer screening as documented by the RN Nurse Navigator.   Lauraine PHEBE Lites, MSN, AGACNP-BC [place] See Amion for personal pager PCCM on call pager [phone]       Virtual Visit via Telephone Note  I connected with Wayne Robinson on 08/20/23 at 12:30 PM EST by telephone and verified that I am speaking with the correct person using two identifiers.  Location: Patient: Wayne Robinson Provider: Laneta Speaks, RN   I discussed the limitations, risks, security and privacy concerns of performing an evaluation and management service by telephone and the availability of in person appointments. I also discussed with the patient that there may be a patient responsible charge related to this service. The patient expressed understanding and agreed to proceed.    Shared Decision Making Visit Lung Cancer Screening Program [phone])   Eligibility: Age 67 y.o. Pack Years Smoking History Calculation 42 (# packs/per year x # years smoked) Recent History of coughing up blood  no Unexplained weight loss? no ( >Than 15 pounds within the last 6 months ) Prior History Lung / other cancer no (Diagnosis within the last 5 years already requiring surveillance chest CT Scans). Smoking Status Former Smoker Former Smokers: Years since quit: 7 yrs  Quit Date: 2017  Visit Components: Discussion included one or more decision making aids. yes Discussion included risk/benefits of screening. yes Discussion included potential follow up diagnostic testing for abnormal scans. yes Discussion included meaning and risk of over diagnosis. yes Discussion included meaning and risk of False Positives. yes Discussion included meaning of total radiation exposure. yes  Counseling  Included: Importance of adherence to annual lung cancer LDCT screening. yes Impact of comorbidities on ability to participate in the program. yes Ability and willingness to under diagnostic treatment. yes  Smoking Cessation Counseling: Current Smokers:  Discussed importance of smoking cessation. yes Information about tobacco cessation classes and interventions provided to patient. yes Patient provided with ticket for LDCT Scan. no Symptomatic Patient. no  Counseling(Intermediate counseling: > three minutes) [id number] Diagnosis Code: Tobacco Use Z72.0 Asymptomatic Patient yes  Counseling (Intermediate counseling: > three minutes counseling) [id number] Former Smokers:  Discussed the importance of maintaining cigarette abstinence. yes Diagnosis Code: Personal History of Nicotine Dependence. S12.108 Information about tobacco cessation classes and interventions provided to patient. Yes Patient provided with ticket for LDCT Scan. no Written Order for Lung Cancer Screening with LDCT placed in Epic. Yes (CT Chest Lung Cancer Screening Low Dose W/O CM) [id number] Z12.2-Screening of respiratory organs Z87.891-Personal history of nicotine dependence   Laneta Speaks, RN

## 2023-08-20 NOTE — Patient Instructions (Signed)

## 2023-08-22 ENCOUNTER — Encounter: Payer: Self-pay | Admitting: Acute Care

## 2023-09-10 DIAGNOSIS — M6208 Separation of muscle (nontraumatic), other site: Secondary | ICD-10-CM | POA: Diagnosis not present

## 2023-09-15 ENCOUNTER — Ambulatory Visit
Admission: RE | Admit: 2023-09-15 | Discharge: 2023-09-15 | Disposition: A | Discharge status: Home / Self Care | Payer: Medicare Other | Source: Ambulatory Visit | Attending: Family Medicine | Admitting: Family Medicine

## 2023-09-15 DIAGNOSIS — Z860101 Personal history of adenomatous and serrated colon polyps: Secondary | ICD-10-CM | POA: Diagnosis not present

## 2023-09-15 DIAGNOSIS — Z122 Encounter for screening for malignant neoplasm of respiratory organs: Secondary | ICD-10-CM

## 2023-09-15 DIAGNOSIS — Z09 Encounter for follow-up examination after completed treatment for conditions other than malignant neoplasm: Secondary | ICD-10-CM | POA: Diagnosis not present

## 2023-09-15 DIAGNOSIS — D122 Benign neoplasm of ascending colon: Secondary | ICD-10-CM | POA: Diagnosis not present

## 2023-09-15 DIAGNOSIS — Z87891 Personal history of nicotine dependence: Secondary | ICD-10-CM | POA: Diagnosis not present

## 2023-09-15 DIAGNOSIS — D123 Benign neoplasm of transverse colon: Secondary | ICD-10-CM | POA: Diagnosis not present

## 2023-09-15 DIAGNOSIS — D124 Benign neoplasm of descending colon: Secondary | ICD-10-CM | POA: Diagnosis not present

## 2023-09-17 DIAGNOSIS — D123 Benign neoplasm of transverse colon: Secondary | ICD-10-CM | POA: Diagnosis not present

## 2023-09-17 DIAGNOSIS — D122 Benign neoplasm of ascending colon: Secondary | ICD-10-CM | POA: Diagnosis not present

## 2023-09-17 DIAGNOSIS — D124 Benign neoplasm of descending colon: Secondary | ICD-10-CM | POA: Diagnosis not present

## 2023-09-29 ENCOUNTER — Telehealth: Payer: Self-pay | Admitting: Acute Care

## 2023-09-29 NOTE — Telephone Encounter (Signed)
Call Report  

## 2023-09-29 NOTE — Telephone Encounter (Signed)
Call report from [facility] radiology:   MPRESSION: 1. 7.7 mm left upper lobe nodule. Lung-RADS 3, probably benign findings. Short-term follow-up in 6 months is recommended with repeat low-dose chest CT without contrast (please use the following order, "CT CHEST LCS NODULE FOLLOW-UP W/O CM"). These results will be called to the ordering clinician or representative by the Radiologist Assistant, and communication documented in the PACS or [employer]. 2. 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 If further evaluation is desired, high-resolution chest CT without contrast could be performed. 3. Tiny gallstones. 4. Three-vessel coronary artery calcification. Aortic atherosclerosis (ICD10-I70.0). 5.  Emphysema (ICD10-J43.9).

## 2023-10-02 ENCOUNTER — Telehealth: Payer: Self-pay | Admitting: Acute Care

## 2023-10-02 DIAGNOSIS — R911 Solitary pulmonary nodule: Secondary | ICD-10-CM

## 2023-10-02 NOTE — Telephone Encounter (Signed)
Called and spoke to pt regarding his LDCT results. Informed him of the 7.51mm nodule noted with indications for a 6 month follow up scan. Pt is agreeable to this. Order placed. We also discussed the other findings within the scan. Patient is aware these results are sent to PCP. Patient is interested in a HRCT to further evaluate the ILD.    IMPRESSION: 1. 7.7 mm left upper lobe nodule. Lung-RADS 3, probably benign findings. Short-term follow-up in 6 months is recommended with repeat low-dose chest CT without contrast (please use the following order, "CT CHEST LCS NODULE FOLLOW-UP W/O CM"). These results will be called to the ordering clinician or representative by the Radiologist Assistant, and communication documented in the PACS or [employer]. 2. 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 If further evaluation is desired, high-resolution chest CT without contrast could be performed. 3. Tiny gallstones. 4. Three-vessel coronary artery calcification. Aortic atherosclerosis (ICD10-I70.0). 5.  Emphysema (ICD10-J43.9).     Electronically Signed   By: Leanna Battles M.D.   On: 09/28/2023 13:53

## 2023-11-25 DIAGNOSIS — M5442 Lumbago with sciatica, left side: Secondary | ICD-10-CM | POA: Diagnosis not present

## 2023-12-09 DIAGNOSIS — K080 Exfoliation of teeth due to systemic causes: Secondary | ICD-10-CM | POA: Diagnosis not present

## 2024-03-06 DIAGNOSIS — I10 Essential (primary) hypertension: Secondary | ICD-10-CM | POA: Diagnosis not present

## 2024-03-06 DIAGNOSIS — E782 Mixed hyperlipidemia: Secondary | ICD-10-CM | POA: Diagnosis not present

## 2024-03-06 DIAGNOSIS — F330 Major depressive disorder, recurrent, mild: Secondary | ICD-10-CM | POA: Diagnosis not present

## 2024-03-30 ENCOUNTER — Ambulatory Visit
Admission: RE | Admit: 2024-03-30 | Discharge: 2024-03-30 | Disposition: A | Discharge status: Home / Self Care | Source: Ambulatory Visit | Attending: Acute Care | Admitting: Acute Care

## 2024-03-30 DIAGNOSIS — R911 Solitary pulmonary nodule: Secondary | ICD-10-CM

## 2024-03-30 DIAGNOSIS — Z87891 Personal history of nicotine dependence: Secondary | ICD-10-CM | POA: Diagnosis not present

## 2024-04-05 DIAGNOSIS — E782 Mixed hyperlipidemia: Secondary | ICD-10-CM | POA: Diagnosis not present

## 2024-04-05 DIAGNOSIS — I10 Essential (primary) hypertension: Secondary | ICD-10-CM | POA: Diagnosis not present

## 2024-04-05 DIAGNOSIS — F330 Major depressive disorder, recurrent, mild: Secondary | ICD-10-CM | POA: Diagnosis not present

## 2024-04-08 ENCOUNTER — Other Ambulatory Visit: Payer: Self-pay | Admitting: Acute Care

## 2024-04-08 DIAGNOSIS — J439 Emphysema, unspecified: Secondary | ICD-10-CM | POA: Diagnosis not present

## 2024-04-08 DIAGNOSIS — R42 Dizziness and giddiness: Secondary | ICD-10-CM | POA: Diagnosis not present

## 2024-04-08 DIAGNOSIS — F331 Major depressive disorder, recurrent, moderate: Secondary | ICD-10-CM | POA: Diagnosis not present

## 2024-04-08 DIAGNOSIS — Z122 Encounter for screening for malignant neoplasm of respiratory organs: Secondary | ICD-10-CM

## 2024-04-08 DIAGNOSIS — Z87891 Personal history of nicotine dependence: Secondary | ICD-10-CM

## 2024-04-08 DIAGNOSIS — R911 Solitary pulmonary nodule: Secondary | ICD-10-CM | POA: Diagnosis not present

## 2024-04-28 ENCOUNTER — Encounter: Payer: Self-pay | Admitting: Acute Care

## 2024-05-06 DIAGNOSIS — F330 Major depressive disorder, recurrent, mild: Secondary | ICD-10-CM | POA: Diagnosis not present

## 2024-05-06 DIAGNOSIS — E782 Mixed hyperlipidemia: Secondary | ICD-10-CM | POA: Diagnosis not present

## 2024-05-06 DIAGNOSIS — I10 Essential (primary) hypertension: Secondary | ICD-10-CM | POA: Diagnosis not present

## 2024-05-11 DIAGNOSIS — F331 Major depressive disorder, recurrent, moderate: Secondary | ICD-10-CM | POA: Diagnosis not present

## 2024-06-06 DIAGNOSIS — E782 Mixed hyperlipidemia: Secondary | ICD-10-CM | POA: Diagnosis not present

## 2024-06-06 DIAGNOSIS — I10 Essential (primary) hypertension: Secondary | ICD-10-CM | POA: Diagnosis not present

## 2024-06-06 DIAGNOSIS — F330 Major depressive disorder, recurrent, mild: Secondary | ICD-10-CM | POA: Diagnosis not present

## 2024-07-02 DIAGNOSIS — D485 Neoplasm of uncertain behavior of skin: Secondary | ICD-10-CM | POA: Diagnosis not present

## 2024-07-02 DIAGNOSIS — Z1283 Encounter for screening for malignant neoplasm of skin: Secondary | ICD-10-CM | POA: Diagnosis not present

## 2024-07-02 DIAGNOSIS — X32XXXA Exposure to sunlight, initial encounter: Secondary | ICD-10-CM | POA: Diagnosis not present

## 2024-07-02 DIAGNOSIS — D225 Melanocytic nevi of trunk: Secondary | ICD-10-CM | POA: Diagnosis not present

## 2024-07-02 DIAGNOSIS — L570 Actinic keratosis: Secondary | ICD-10-CM | POA: Diagnosis not present

## 2024-07-04 DIAGNOSIS — S79912A Unspecified injury of left hip, initial encounter: Secondary | ICD-10-CM | POA: Diagnosis not present

## 2024-07-04 DIAGNOSIS — M25552 Pain in left hip: Secondary | ICD-10-CM | POA: Diagnosis not present

## 2024-07-05 DIAGNOSIS — M25552 Pain in left hip: Secondary | ICD-10-CM | POA: Diagnosis not present

## 2024-07-06 DIAGNOSIS — E782 Mixed hyperlipidemia: Secondary | ICD-10-CM | POA: Diagnosis not present

## 2024-07-06 DIAGNOSIS — F330 Major depressive disorder, recurrent, mild: Secondary | ICD-10-CM | POA: Diagnosis not present

## 2024-07-06 DIAGNOSIS — I10 Essential (primary) hypertension: Secondary | ICD-10-CM | POA: Diagnosis not present

## 2024-07-09 ENCOUNTER — Other Ambulatory Visit: Payer: Self-pay

## 2024-07-09 ENCOUNTER — Emergency Department

## 2024-07-09 ENCOUNTER — Emergency Department
Admission: EM | Admit: 2024-07-09 | Discharge: 2024-07-09 | Disposition: A | Discharge status: Home / Self Care | Payer: Worker's Compensation | Attending: Emergency Medicine | Admitting: Emergency Medicine

## 2024-07-09 DIAGNOSIS — W19XXXA Unspecified fall, initial encounter: Secondary | ICD-10-CM | POA: Insufficient documentation

## 2024-07-09 DIAGNOSIS — I10 Essential (primary) hypertension: Secondary | ICD-10-CM | POA: Diagnosis not present

## 2024-07-09 DIAGNOSIS — Y990 Civilian activity done for income or pay: Secondary | ICD-10-CM | POA: Diagnosis not present

## 2024-07-09 DIAGNOSIS — M25552 Pain in left hip: Secondary | ICD-10-CM | POA: Diagnosis present

## 2024-07-09 DIAGNOSIS — M87052 Idiopathic aseptic necrosis of left femur: Secondary | ICD-10-CM | POA: Insufficient documentation

## 2024-07-09 MED ORDER — NAPROXEN 500 MG PO TBEC: 500.0000 mg | DELAYED_RELEASE_TABLET | Freq: Two times a day (BID) | ORAL | 0 refills | Status: AC

## 2024-07-09 NOTE — ED Provider Notes (Signed)
 [facility] Provider Note    Event Date/Time   First MD Initiated Contact with Patient 07/09/24 1956     (approximate)   History   Hip Pain    HPI  Wayne Robinson is a 68 y.o. male    with a past medical history of acute pain left hip, pulmonary emphysema, essential hypertension, mild depressive disorder, adenomatous polyp of colon, who presents to the ED complaining of left hip pain after falling at work. According to the patient patient had a fall last Wednesday with subsequent left hip pain.  Patient was seen in urgent care in [place], they took x-ray with possible avascular necrosis of the head of the femur.  Radiologist suggest if the symptoms continue to order CT or an MRI.  Patient describes burning sensation from the hip down to his thigh when he is walking.  Patient is not taking any blood thinner.     There are no active problems to display for this patient.    ROS: Patient currently denies any vision changes, tinnitus, difficulty speaking, facial droop, sore throat, chest pain, shortness of breath, abdominal pain, nausea/vomiting/diarrhea, dysuria, or weakness/numbness/paresthesias in any extremity   Physical Exam   Triage Vital Signs: ED Triage Vitals  Encounter Vitals Group     BP 07/09/24 1838 (!) 152/97     Girls Systolic BP Percentile --      Girls Diastolic BP Percentile --      Boys Systolic BP Percentile --      Boys Diastolic BP Percentile --      Pulse Rate 07/09/24 1838 76     Resp 07/09/24 1838 16     Temp 07/09/24 1838 98.1 F (36.7 C)     Temp Source 07/09/24 1838 Oral     SpO2 07/09/24 1838 98 %     Weight --      Height 07/09/24 1839 6' (1.829 m)     Head Circumference --      Peak Flow --      Pain Score 07/09/24 1839 0     Pain Loc --      Pain Education --      Exclude from Growth Chart --     Most recent vital signs: Vitals:   07/09/24 1838  BP: (!) 152/97  Pulse: 76  Resp: 16  Temp: 98.1 F  (36.7 C)  SpO2: 98%     Physical Exam Vitals and nursing note reviewed.  In triage patient was hypertensive  General:          Awake, no distress.  CV:                  Good peripheral perfusion.  Resp:               Normal effort. no tachypnea Abd:                 No distention.  Soft nontender Other:              Left hip: Tender to palpation in the left lateral area.  Patient can walk.  He describes a burning sensation inside his thigh when walking.  Full ROM.  Pulses positive.  Sensation is intact ED Results / Procedures / Treatments   Labs (all labs ordered are listed, but only abnormal results are displayed) Labs Reviewed - No data to display   RADIOLOGY I independently reviewed and interpreted imaging and agree with radiologists findings.  PROCEDURES:  Critical Care performed:   Procedures   MEDICATIONS ORDERED IN ED: Medications - No data to display Clinical Course as of 07/09/24 2145  Fri Jul 09, 2024  2141 Updated patient with results of CT. patient is ready to discharge, with prescription for naproxen and follow-up with [facility].  I offered crutches to the patient but he refused, he has some at home. [AE]    Clinical Course User Index [AE] Janit Kast, PA-C    IMPRESSION / MDM / ASSESSMENT AND PLAN / ED COURSE  I reviewed the triage vital signs and the nursing notes.  Differential diagnosis includes, but is not limited to, avascular necrosis of the femur, fracture, dislocation  Patient's presentation is most consistent with acute complicated illness / injury requiring diagnostic workup.   Wayne Robinson is a 68 y.o., male since today with history of fall with subsequent left hip pain.  Physical exam tenderness to palpation in the lateral area of the left hip.  Patient is able to walk but describe pain as a burning sensation on his thigh.  Full ROM, sensation intact, pulses positive. Plan Left hip CT Offered pain medication but patient  refused he is taking ibuprofen  800 mg every 8 hours.  Left hip CT left hip Patient's diagnosis is consistent with early avascular necrosis. I independently reviewed and interpreted imaging and agree with radiologists findings. I did review the patient's allergies and medications.The patient is in stable and satisfactory condition for discharge home  Patient will be discharged home with prescriptions for naproxe. Patient is to follow up with orthopedics as needed or otherwise directed. Patient is given ED precautions to return to the ED for any worsening or new symptoms. Discussed plan of care with patient, answered all of patient's questions, Patient agreeable to plan of care. Advised patient to take medications according to the instructions on the label. Discussed possible side effects of new medications. Patient verbalized understanding.  FINAL CLINICAL IMPRESSION(S) / ED DIAGNOSES   Final diagnoses:  Left hip pain  Avascular necrosis of bone of hip, left (HCC)     Rx / DC Orders   ED Discharge Orders          Ordered    naproxen (EC NAPROSYN) 500 MG EC tablet  2 times daily with meals        07/09/24 2145             Note:  This document was prepared using Dragon voice recognition software and may include unintentional dictation errors.   Janit Kast, PA-C 07/09/24 2145    Floy Roberts, MD 07/09/24 2223

## 2024-07-09 NOTE — Discharge Instructions (Signed)
 Have been diagnosed with left hip pain, avascular necrosis of the left femur.  Please take naproxen 1 tablet every 12 hours after main meals.  Please follow-up with [facility].  Please use crutches to release pressure injury hip.  Please come back to ED or go to your PCP you have any symptoms or symptoms worsen.  It was a pleasure to help you today Lorane Sar, PA

## 2024-07-09 NOTE — ED Notes (Addendum)
 Completed pt workers comp at this time. Filled out form and took it straight to laboratory.

## 2024-07-09 NOTE — ED Triage Notes (Signed)
 Pt to ed from home via POV for workers comp injury. Pt has been seen by an UC for same and had an Xray already of his left hip. Pt fell at work. Pt works at [employer] in [place]. Pt is caox4, in no acute distress and ambulatory in triage.

## 2024-08-05 ENCOUNTER — Other Ambulatory Visit: Payer: Self-pay | Admitting: Family Medicine

## 2024-08-05 DIAGNOSIS — I7143 Infrarenal abdominal aortic aneurysm, without rupture: Secondary | ICD-10-CM

## 2024-08-06 DIAGNOSIS — I10 Essential (primary) hypertension: Secondary | ICD-10-CM | POA: Diagnosis not present

## 2024-08-06 DIAGNOSIS — F330 Major depressive disorder, recurrent, mild: Secondary | ICD-10-CM | POA: Diagnosis not present

## 2024-08-06 DIAGNOSIS — E782 Mixed hyperlipidemia: Secondary | ICD-10-CM | POA: Diagnosis not present

## 2024-08-13 ENCOUNTER — Ambulatory Visit
Admission: RE | Admit: 2024-08-13 | Discharge: 2024-08-13 | Disposition: A | Discharge status: Home / Self Care | Source: Ambulatory Visit | Attending: Family Medicine | Admitting: Family Medicine

## 2024-08-13 DIAGNOSIS — Z0000 Encounter for general adult medical examination without abnormal findings: Secondary | ICD-10-CM | POA: Diagnosis not present

## 2024-08-13 DIAGNOSIS — I7143 Infrarenal abdominal aortic aneurysm, without rupture: Secondary | ICD-10-CM

## 2024-08-13 DIAGNOSIS — I10 Essential (primary) hypertension: Secondary | ICD-10-CM | POA: Diagnosis not present

## 2024-08-13 DIAGNOSIS — E782 Mixed hyperlipidemia: Secondary | ICD-10-CM | POA: Diagnosis not present

## 2024-08-13 DIAGNOSIS — N529 Male erectile dysfunction, unspecified: Secondary | ICD-10-CM | POA: Diagnosis not present

## 2024-08-13 DIAGNOSIS — Z125 Encounter for screening for malignant neoplasm of prostate: Secondary | ICD-10-CM | POA: Diagnosis not present

## 2024-08-13 DIAGNOSIS — F331 Major depressive disorder, recurrent, moderate: Secondary | ICD-10-CM | POA: Diagnosis not present

## 2024-08-13 DIAGNOSIS — Z23 Encounter for immunization: Secondary | ICD-10-CM | POA: Diagnosis not present

## 2024-09-05 DIAGNOSIS — E782 Mixed hyperlipidemia: Secondary | ICD-10-CM | POA: Diagnosis not present

## 2024-09-05 DIAGNOSIS — F330 Major depressive disorder, recurrent, mild: Secondary | ICD-10-CM | POA: Diagnosis not present

## 2024-09-05 DIAGNOSIS — I10 Essential (primary) hypertension: Secondary | ICD-10-CM | POA: Diagnosis not present

## 2024-09-08 DIAGNOSIS — M5416 Radiculopathy, lumbar region: Secondary | ICD-10-CM | POA: Diagnosis not present
